# Patient Record
Sex: Female | Born: 1960 | Race: Black or African American | Hispanic: No | Marital: Married | Smoking: Never smoker
Health system: Southern US, Community
[De-identification: ages and names within clinical notes are randomized; demographics above are authoritative.]

## PROBLEM LIST (undated history)

## (undated) DIAGNOSIS — I10 Essential (primary) hypertension: Secondary | ICD-10-CM

## (undated) DIAGNOSIS — I8390 Asymptomatic varicose veins of unspecified lower extremity: Secondary | ICD-10-CM

## (undated) DIAGNOSIS — J45909 Unspecified asthma, uncomplicated: Secondary | ICD-10-CM

## (undated) HISTORY — DX: Unspecified asthma, uncomplicated: J45.909

## (undated) HISTORY — PX: ABDOMINAL HYSTERECTOMY: SHX81

## (undated) HISTORY — DX: Essential (primary) hypertension: I10

## (undated) HISTORY — DX: Asymptomatic varicose veins of unspecified lower extremity: I83.90

---

## 1998-08-30 ENCOUNTER — Encounter: Payer: Self-pay | Admitting: Obstetrics and Gynecology

## 1998-08-30 ENCOUNTER — Ambulatory Visit (HOSPITAL_COMMUNITY): Admission: RE | Admit: 1998-08-30 | Discharge: 1998-08-30 | Payer: Self-pay | Admitting: Obstetrics and Gynecology

## 1998-09-06 ENCOUNTER — Ambulatory Visit (HOSPITAL_COMMUNITY): Admission: RE | Admit: 1998-09-06 | Discharge: 1998-09-06 | Payer: Self-pay | Admitting: Obstetrics and Gynecology

## 1998-09-06 ENCOUNTER — Encounter: Payer: Self-pay | Admitting: Obstetrics and Gynecology

## 2000-01-09 ENCOUNTER — Encounter: Admission: RE | Admit: 2000-01-09 | Discharge: 2000-04-08 | Payer: Self-pay | Admitting: *Deleted

## 2000-02-20 ENCOUNTER — Other Ambulatory Visit: Admission: RE | Admit: 2000-02-20 | Discharge: 2000-02-20 | Payer: Self-pay | Admitting: *Deleted

## 2000-07-02 ENCOUNTER — Ambulatory Visit (HOSPITAL_COMMUNITY): Admission: RE | Admit: 2000-07-02 | Discharge: 2000-07-02 | Payer: Self-pay | Admitting: Obstetrics and Gynecology

## 2000-07-02 ENCOUNTER — Encounter: Payer: Self-pay | Admitting: Obstetrics and Gynecology

## 2001-07-02 ENCOUNTER — Encounter: Admission: RE | Admit: 2001-07-02 | Discharge: 2001-07-02 | Payer: Self-pay | Admitting: *Deleted

## 2001-07-02 ENCOUNTER — Encounter: Payer: Self-pay | Admitting: *Deleted

## 2001-09-01 ENCOUNTER — Other Ambulatory Visit: Admission: RE | Admit: 2001-09-01 | Discharge: 2001-09-01 | Payer: Self-pay | Admitting: Obstetrics and Gynecology

## 2001-10-13 ENCOUNTER — Encounter: Admission: RE | Admit: 2001-10-13 | Discharge: 2001-10-13 | Payer: Self-pay | Admitting: *Deleted

## 2001-10-13 ENCOUNTER — Encounter: Payer: Self-pay | Admitting: *Deleted

## 2002-04-27 ENCOUNTER — Ambulatory Visit (HOSPITAL_COMMUNITY): Admission: RE | Admit: 2002-04-27 | Discharge: 2002-04-27 | Payer: Self-pay | Admitting: Gastroenterology

## 2002-09-07 ENCOUNTER — Other Ambulatory Visit: Admission: RE | Admit: 2002-09-07 | Discharge: 2002-09-07 | Payer: Self-pay | Admitting: Obstetrics and Gynecology

## 2003-05-19 ENCOUNTER — Encounter: Admission: RE | Admit: 2003-05-19 | Discharge: 2003-05-19 | Payer: Self-pay

## 2003-06-27 ENCOUNTER — Encounter: Admission: RE | Admit: 2003-06-27 | Discharge: 2003-09-25 | Payer: Self-pay

## 2003-10-25 ENCOUNTER — Ambulatory Visit (HOSPITAL_COMMUNITY): Admission: RE | Admit: 2003-10-25 | Discharge: 2003-10-25 | Payer: Self-pay | Admitting: Obstetrics and Gynecology

## 2003-11-02 ENCOUNTER — Other Ambulatory Visit: Admission: RE | Admit: 2003-11-02 | Discharge: 2003-11-02 | Payer: Self-pay | Admitting: Obstetrics and Gynecology

## 2003-12-21 ENCOUNTER — Encounter: Admission: RE | Admit: 2003-12-21 | Discharge: 2003-12-21 | Payer: Self-pay | Admitting: Family Medicine

## 2004-11-06 ENCOUNTER — Other Ambulatory Visit: Admission: RE | Admit: 2004-11-06 | Discharge: 2004-11-06 | Payer: Self-pay | Admitting: Obstetrics and Gynecology

## 2004-11-13 ENCOUNTER — Ambulatory Visit (HOSPITAL_COMMUNITY): Admission: RE | Admit: 2004-11-13 | Discharge: 2004-11-13 | Payer: Self-pay | Admitting: Obstetrics and Gynecology

## 2005-04-30 ENCOUNTER — Encounter (INDEPENDENT_AMBULATORY_CARE_PROVIDER_SITE_OTHER): Payer: Self-pay | Admitting: Specialist

## 2005-04-30 ENCOUNTER — Observation Stay (HOSPITAL_COMMUNITY): Admission: RE | Admit: 2005-04-30 | Discharge: 2005-05-01 | Payer: Self-pay | Admitting: Obstetrics and Gynecology

## 2005-09-11 ENCOUNTER — Encounter: Admission: RE | Admit: 2005-09-11 | Discharge: 2005-09-11 | Payer: Self-pay | Admitting: Internal Medicine

## 2005-11-27 ENCOUNTER — Ambulatory Visit (HOSPITAL_COMMUNITY): Admission: RE | Admit: 2005-11-27 | Discharge: 2005-11-27 | Payer: Self-pay | Admitting: Internal Medicine

## 2006-02-05 ENCOUNTER — Other Ambulatory Visit: Admission: RE | Admit: 2006-02-05 | Discharge: 2006-02-05 | Payer: Self-pay | Admitting: Obstetrics and Gynecology

## 2006-07-08 ENCOUNTER — Ambulatory Visit (HOSPITAL_COMMUNITY): Admission: RE | Admit: 2006-07-08 | Discharge: 2006-07-08 | Payer: Self-pay | Admitting: Gastroenterology

## 2006-12-15 ENCOUNTER — Ambulatory Visit (HOSPITAL_COMMUNITY): Admission: RE | Admit: 2006-12-15 | Discharge: 2006-12-15 | Payer: Self-pay | Admitting: Internal Medicine

## 2006-12-24 ENCOUNTER — Encounter: Admission: RE | Admit: 2006-12-24 | Discharge: 2006-12-24 | Payer: Self-pay | Admitting: Internal Medicine

## 2007-07-28 ENCOUNTER — Ambulatory Visit (HOSPITAL_COMMUNITY): Admission: RE | Admit: 2007-07-28 | Discharge: 2007-07-28 | Payer: Self-pay | Admitting: Surgery

## 2007-08-11 ENCOUNTER — Encounter: Admission: RE | Admit: 2007-08-11 | Discharge: 2007-08-11 | Payer: Self-pay | Admitting: Surgery

## 2007-08-17 ENCOUNTER — Ambulatory Visit (HOSPITAL_COMMUNITY): Admission: RE | Admit: 2007-08-17 | Discharge: 2007-08-17 | Payer: Self-pay | Admitting: Surgery

## 2007-09-23 HISTORY — PX: ROUX-EN-Y PROCEDURE: SUR1287

## 2007-12-30 ENCOUNTER — Ambulatory Visit (HOSPITAL_COMMUNITY): Admission: RE | Admit: 2007-12-30 | Discharge: 2007-12-30 | Payer: Self-pay | Admitting: Obstetrics and Gynecology

## 2008-02-29 ENCOUNTER — Encounter: Admission: RE | Admit: 2008-02-29 | Discharge: 2008-04-04 | Payer: Self-pay | Admitting: Surgery

## 2008-03-28 ENCOUNTER — Inpatient Hospital Stay (HOSPITAL_COMMUNITY): Admission: RE | Admit: 2008-03-28 | Discharge: 2008-03-30 | Payer: Self-pay | Admitting: Surgery

## 2008-03-29 ENCOUNTER — Encounter (INDEPENDENT_AMBULATORY_CARE_PROVIDER_SITE_OTHER): Payer: Self-pay | Admitting: Surgery

## 2008-03-29 ENCOUNTER — Ambulatory Visit: Payer: Self-pay | Admitting: Surgery

## 2008-05-30 ENCOUNTER — Encounter: Admission: RE | Admit: 2008-05-30 | Discharge: 2008-05-30 | Payer: Self-pay | Admitting: Surgery

## 2008-08-30 ENCOUNTER — Encounter: Admission: RE | Admit: 2008-08-30 | Discharge: 2008-08-30 | Payer: Self-pay | Admitting: Surgery

## 2008-11-29 ENCOUNTER — Encounter: Admission: RE | Admit: 2008-11-29 | Discharge: 2008-11-29 | Payer: Self-pay | Admitting: Surgery

## 2009-01-01 ENCOUNTER — Ambulatory Visit (HOSPITAL_COMMUNITY): Admission: RE | Admit: 2009-01-01 | Discharge: 2009-01-01 | Payer: Self-pay | Admitting: Obstetrics and Gynecology

## 2009-05-08 ENCOUNTER — Encounter: Admission: RE | Admit: 2009-05-08 | Discharge: 2009-05-08 | Payer: Self-pay | Admitting: Surgery

## 2010-01-09 ENCOUNTER — Ambulatory Visit (HOSPITAL_COMMUNITY): Admission: RE | Admit: 2010-01-09 | Discharge: 2010-01-09 | Payer: Self-pay | Admitting: Obstetrics and Gynecology

## 2010-05-17 ENCOUNTER — Encounter: Admission: RE | Admit: 2010-05-17 | Discharge: 2010-05-17 | Payer: Self-pay | Admitting: Internal Medicine

## 2010-06-24 ENCOUNTER — Ambulatory Visit: Payer: Self-pay | Admitting: Vascular Surgery

## 2010-09-24 ENCOUNTER — Ambulatory Visit: Admit: 2010-09-24 | Payer: Self-pay | Admitting: Vascular Surgery

## 2010-10-29 ENCOUNTER — Ambulatory Visit: Payer: Self-pay | Admitting: Vascular Surgery

## 2011-01-09 ENCOUNTER — Other Ambulatory Visit (HOSPITAL_COMMUNITY): Payer: Self-pay | Admitting: Obstetrics and Gynecology

## 2011-01-09 DIAGNOSIS — Z1231 Encounter for screening mammogram for malignant neoplasm of breast: Secondary | ICD-10-CM

## 2011-01-14 ENCOUNTER — Ambulatory Visit (HOSPITAL_COMMUNITY)
Admission: RE | Admit: 2011-01-14 | Discharge: 2011-01-14 | Disposition: A | Discharge status: Home / Self Care | Payer: 59 | Source: Ambulatory Visit | Attending: Obstetrics and Gynecology | Admitting: Obstetrics and Gynecology

## 2011-01-14 DIAGNOSIS — Z1231 Encounter for screening mammogram for malignant neoplasm of breast: Secondary | ICD-10-CM

## 2011-01-15 ENCOUNTER — Other Ambulatory Visit: Payer: Self-pay | Admitting: Obstetrics and Gynecology

## 2011-01-15 DIAGNOSIS — R928 Other abnormal and inconclusive findings on diagnostic imaging of breast: Secondary | ICD-10-CM

## 2011-01-22 ENCOUNTER — Ambulatory Visit
Admission: RE | Admit: 2011-01-22 | Discharge: 2011-01-22 | Disposition: A | Discharge status: Home / Self Care | Payer: 59 | Source: Ambulatory Visit | Attending: Obstetrics and Gynecology | Admitting: Obstetrics and Gynecology

## 2011-01-22 DIAGNOSIS — R928 Other abnormal and inconclusive findings on diagnostic imaging of breast: Secondary | ICD-10-CM

## 2011-02-04 NOTE — Op Note (Signed)
Charlene Pearson, Charlene Pearson            ACCOUNT NO.:  [id number]   MEDICAL RECORD NO.:  [id number]          PATIENT TYPE:  INP   LOCATION:  0004                         FACILITY:  [facility]   PHYSICIAN:  Sandria Bales. Ezzard Standing, M.D.  DATE OF BIRTH:  [birth year]   DATE OF PROCEDURE:  03/28/2008  DATE OF DISCHARGE:                               OPERATIVE REPORT   Date of Surgery ?   PREOP DIAGNOSIS:  Morbid Obesity (BMI 44.6, Wt. 268)   POST OP DIAGNOSIS:  Morbid Obesity (BMI 44.6, Wt. 268)   PROCEDURE:  Laparoscopic Roux-En-Y Gastric Bypass (Antecolic, Antegastric)   SURGEON:  Ovidio Kin, M.D.   FIRST ASSISTANT:  Reggie Pile, M.D.   ANESTHESIA:  General Endotracheal   INDICATIONS FOR PROCEDURE:  This is a 50 year old black female, a  patient of Dr. Jerelyn Scott Husain's, who is morbidly obese and interested in  bariatric surgery.  She is interested in the RYGB and has been through  our preoperative bariatric program.   INFORMED CONSENT:  The indications and potential complications of a RYGB  were explained to the patient.  The potential complications include, but  are not limited to, bleeding, infection, perforation of the bowel, deep  venous thrombosis, open surgery, and long-term nutritional consequences.   DESCRIPTION OF PROCEDURE:  The patient presented to the operating room  and underwent general endotracheal anesthesia.  Her abdomen was prepped  with Techni-Care.  She was given 2 grams of cefoxitin at the initiation  of the procedure and PAS stockings placed and a Foley in place.  A time  out was held, identifying the patient and the procedure.  I accessed the  abdominal cavity with a 12 mm Opti-View Ethicon trocar in the left upper  quadrant.  I then placed six additional trocars, a 5 mm subxiphoid for  the liver retractor, a 12 mm right subcostal trocar, a 12 mm right  lateral subcostal trocar, an 11 mm trocar lateral to the umbilicus for  the camera in the lower part and a 5 mm  trocar in the left lateral  quadrant.   First, I did an abdominal exploration.  She did have some adhesions over  her mid-anterior abdominal wall for unclear reasons.  I took these  omental adhesions down.  They appeared benign.  Both lobes of the liver  were unremarkable.  The stomach was unremarkable.  The bowel that I  could see was unremarkable.  There were no other masses or nodules  within the abdomen.   I then identified the ligament of Treitz.  I counted 40 cm beyond this  and divided the jejunum with a light load of the Endo GI 45 mm Ethicon  stapler.  I divided the mesentery.  I then marked the future catheter  plane with a Penrose drain.  I then counted 100 cm for the future  gastric limb and did a side-to-side jejunojejunostomy.  I did this  anastomosis using a 45 mm Ethicon Endo GI stapler, and then I closed the  enterotomy with two running #2-0 Vicryl sutures.  I did have to put in  three additional sutures to  make sure the enterotomy was completely  closed.  I then closed the jejunal defect with #2-0 silk suture and  placed Tisseel over the jejunojejunostomy.  I then divided the omentum  down to the transverse colon, to allow the future jejunal lumen to reach  more easily to the stomach.   I then turned my attention to the upper abdomen.  The patient was placed  in a reverse Trendelenburg with her head up, and Nathan's retractor  placed over the left lobe of the liver.  I created a gastric pouch which  is approximately 5 cm x 3 cm in size.  I first went along the angle of  His and opened this.  I then went along the lesser curvature and divided  into the lesser sac.  I then used a 45 mm blue Ethicon stapler.  Then I  used two #60 blue staples and then another #45 blue staple to create the  stomach pouch.   I oversewed the gastric remnant with a running #2-0 Vicryl suture.  She  did have one little bleeder about one-third up the gastric remnant.  I  placed two clips on  this.  I then placed a Tisseel over the greater  curvature new pouch.  I then started my gastrojejunal anastomosis.  I  did a running posterior suture of #2-0 Vicryl suture.  I then cauterized  and did an enterotomy of the stomach and the small bowel and then  stapled a side-to-side anastomosis with an Endo GIA blue staple load.  I  then closed the enterotomy with two interrupted #2-0 Vicryl sutures.  I  then did an anterior 2-0 Vicryl suture running, so she had a double  layer closure.  I passed the Ewald tube through the anastomosis before  the second layer was closed.   At this time Dr. Alfonse Ras scrubbed out and I clamped off the  small bowel.  She  did an upper endoscopy.  She saw a 4 cm pouch.  The  gastrojejunostomy was open.  I saw the upper abdomen and there was no  evidence of any air leak.  I then irrigated the abdomen.  I looked at  the Peterson's defect.  It looked like the colon was well tucked behind  it.  It was not even accessible, so I did not do any kind of Peterson's  defect closure.  I then placed Tisseel over the gastrojejunostomy, over  the jejunostomy.  I then re-inspected the abdomen.  I saw no  evidence  of a leak or bleeding.  I removed all the trocars.  The skin entry site  was injected with a total of about 30 mL of 0.25% Marcaine.  I closed  the skin with skin staples and sterilely dressed the wound.    Sponge and needle count were correct at the end of the case.   The patient tolerated the procedure well and was transported to the  recovery room in good condition.      Sandria Bales. Ezzard Standing, M.D.  Electronically Signed     DHN/MEDQ  D:  03/28/2008  T:  03/28/2008  Job:  [id number]   cc:   Georgann Housekeeper, MD  Fax: [phone]   Anselmo Rod, M.D.  Fax: [phone]

## 2011-02-04 NOTE — Consult Note (Signed)
NEW PATIENT CONSULTATION   Charlene, SCHUL Pearson  DOB:  04/02/1961                                       06/24/2010  EAVWU#:[id number]   The patient is a 50 year old female patient with a long history of  venous insufficiency which has recently worsened and caused increasing  symptomatology.  She had laser ablation performed at [facility] in 2005 for bulging varicosities and had a minor stripping  procedure done on the right leg below the knee at the same facility.  She has developed recurrent varicosities in the left calf with diffuse  reticular veins becoming progressively more severe involving the distal  thigh, calf and ankle area.  She has also developed an increased pattern  of reticular and bulging varicosities on the right side particularly in  the medial right thigh and medial calf and extending down on to the  ankle.  She has no history of DVT or superficial thrombophlebitis.  She  has had no bleeding or ulceration.  She has not worn elastic compression  stockings recently and elevation does not seem to improve her  symptomatology.  She cannot take ibuprofen because of recent weight loss  surgery.  She states that the symptoms which she describes as burning  and throbbing in nature are continuing to worsen and affect her ability  to stand and work.   CHRONIC MEDICAL PROBLEMS:  1. Hypertension.  2. History of morbid obesity with weight loss surgery in 2009.  3. History of asthma occasionally.  4. History of borderline diabetes and hyperlipidemia which have been      relieved by her weight loss surgery.  5. Negative for coronary artery disease or stroke.   SOCIAL HISTORY:  She is married, has two children, is self-employed as a  Patent attorney.  She does not use tobacco or alcohol.   Family history is positive for diabetes and stroke in her mother.  Positive for coronary artery disease and diabetes in a grandmother.   REVIEW OF SYSTEMS:   She has lost 90 pounds since weight loss surgery 2  years ago.  She does have discomfort in her legs as noted above.  Able  to ambulate up to 1 mile.  Denies any chest pain, dyspnea on exertion,  heartburn, urinary symptoms.  All other systems are negative in complete  review of systems.   PHYSICAL EXAM:  Vital signs:  Blood pressure 98/60, heart rate 60,  respirations 22.  General:  She is a well-developed, well-nourished  female in no apparent distress, alert and oriented x3.  HEENT:  Exam  normal for age.  EOMs intact.  Lungs:  Clear to auscultation.  No  rhonchi or wheezing.  Cardiovascular:  Regular rhythm.  No murmurs.  Carotid pulses 3+.  No bruits.  Abdomen:  Soft, nontender with no  masses.  Musculoskeletal:  Free of major deformities.  Neurological:  Normal.  Skin:  Free of rashes.  Lower extremity:  Exam reveals 3+  femoral, popliteal and dorsalis pedis pulses bilaterally.  Left leg has  bulging varicosities in the medial calf over the great saphenous system.  She has multiple reticular veins in the medial and lateral thigh and  calf area extending down to the left ankle with 1+ edema.  The right leg  has an extensive pattern of reticular veins and bulging varicosities in  the medial thigh and medial calf with 1+ edema distally and extensive  reticular veins extending down the lower third of the leg on to the  ankle and lateral foot.  No rashes or ulcers noted.   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  No right leg study was done.  I performed a  bedside SonoSite ultrasound study today which revealed the right leg to  have gross reflux at the right saphenofemoral junction throughout the  right great saphenous vein feeding these varicosities.  On the left leg  there is reflux at the saphenofemoral junction with some patency of the  great saphenous  vein down to the knee level which seems intermittent.  I  recommended long-leg elastic compression stockings (20 mm-30 mm  gradient) as well as elevation and Tylenol which she can take for pain  since she cannot take ibuprofen.  She will return in 3 months.  If there  has been no improvement I think she should have the following:  1) laser  ablation to the right great saphenous vein with some stab phlebectomies  at the time of the procedure and three courses of sclerotherapy, 2) will  make a decision regarding further laser ablation of the left great  saphenous system following a formal ultrasound study and/or stab  phlebectomies and sclerotherapy on the left side.  She will return in 3  months.     Charlene Pearson, Pearson.D.  Electronically Signed   JDL/MEDQ  D:  06/24/2010  T:  06/25/2010  Job:  [id number]   cc:   Georgann Housekeeper, MD

## 2011-02-07 NOTE — Op Note (Signed)
   NAME:  Charlene Pearson, Charlene Pearson                      ACCOUNT NO.:  [id number]   MEDICAL RECORD NO.:  [id number]                   PATIENT TYPE:  AMB   LOCATION:  ENDO                                 FACILITY:  [facility]   PHYSICIAN:  Charna Elizabeth, M.D.                   DATE OF BIRTH:  05-12-1961   DATE OF PROCEDURE:  04/27/2002  DATE OF DISCHARGE:                                 OPERATIVE REPORT   PROCEDURE PERFORMED:  Esophagogastroduodenoscopy.   ENDOSCOPIST:  Charna Elizabeth, M.D.   INSTRUMENT USED:  Olympus video panendoscope.   INDICATIONS FOR PROCEDURE:  50 year old African-American female with a  history of iron deficiency anemia. Rule out peptic ulcer disease,  esophagitis, and gastritis.   PREPROCEDURE PREPARATION:  Informed consent was procured from the patient.  The patient fasted for eight hours prior to the procedure.   PREPROCEDURE PHYSICAL:  VITAL SIGNS:  The patient had stable vital signs.  NECK:  Supple.  LUNGS:  Chest was clear to auscultation.  CARDIAC:  S1, S2 regular.  ABDOMEN:  Abdomen is soft with normal bowel sounds.   DESCRIPTION OF PROCEDURE:  The patient was placed in the left lateral  decubitus position.  Sedated with 40 mg of Demerol and 4 mg of Versed  intravenously.  Once the patient was adequately sedated and maintained on  low-flow oxygen and continuous cardiac monitoring, the Olympus video  panendoscope was advanced through the mouthpiece, over the tongue, into the  esophagus, and under direct vision, the entire esophagus appeared normal  with no evidence of ring, stricture, masses, esophagitis, or Barrett's  mucosa.  The scope was then advanced in the stomach.  A hiatal hernia was  seen on retroflexion, but the patient did not tolerate retroflexion very  well.  There was some residual debris in the stomach.  The exact time when  the patient last ate was not clear.  However, no solid debris was present,  and the antrum and the proximal stomach appeared  normal without lesions.   IMPRESSION:  1. Hiatal hernia.  2. Some debris (liquid) along greater curvature.  3. Otherwise normal-appearing stomach and proximal small bowel.    RECOMMENDATIONS:  1. Proceed with colonoscopy at this time.  2. Gastric emptying study if the patient has any symptoms of gastroparesis.  3. Further recommendations after colonoscopy.                                               Charna Elizabeth, M.D.    JM/MEDQ  D:  04/27/2002  T:  05/01/2002  Job:  [id number]   cc:   Heather Roberts, M.D.

## 2011-02-07 NOTE — Discharge Summary (Signed)
Charlene Pearson, Charlene Pearson            ACCOUNT NO.:  [id number]   MEDICAL RECORD NO.:  [id number]          PATIENT TYPE:  INP   LOCATION:  9304                          FACILITY:  [facility]   PHYSICIAN:  Hal Morales, M.D.DATE OF BIRTH:  [birth year]   DATE OF ADMISSION:  04/30/2005  DATE OF DISCHARGE:  05/01/2005                                 DISCHARGE SUMMARY   DISCHARGE DIAGNOSES:  1.  Chronic pelvic pain.  2.  Endometriosis.  3.  Uterine fibroids.  4.  Menorrhagia.   OPERATION:  On the date of admission, the patient underwent laparoscopy  followed by a total vaginal hysterectomy with uterine morcellation,  tolerating the procedure well. The patient's uterus was enlarged to  approximately 14-week size with several uterine fibroids and general global  uterine enlargement. The tubes were status post tubal interruption for  sterilization. The ovaries were normal without evidence of any of  endometriosis. There was no evidence of endometriosis in the posterior cul-  de-sac.   HISTORY OF PRESENT ILLNESS:  Charlene Pearson is a 50 year old married African-  American female para 3-0-0-3 who presents for a hysterectomy because of  chronic pelvic pain, menorrhagia, and endometriosis. Please see the  patient's dictated history and physical examination for details.   PREOPERATIVE PHYSICAL EXAMINATION:  VITAL SIGNS:  Blood pressure 130/70,  weight is 259, height is 5 feet, 6-and-three-quarter inches tall.  GENERAL EXAM:  Within normal limits.  PELVIC EXAM:  EG/BUS is within normal limits. Vagina is rugose. Cervix is  nontender without lesions. Uterus appears 12- to 14-week size without  tenderness. Adnexa without tenderness or masses. Rectovaginal exam was  without tenderness or masses.   HOSPITAL COURSE:  On the date of admission the patient underwent  aforementioned procedures, tolerating them all well. Postoperative course  was unremarkable, with the patient tolerating a postoperative  hemoglobin of  9.4 (preoperative hemoglobin 10.4). By postoperative day #1, the patient had  resumed bowel and bladder function and was therefore deemed ready for  discharge home.   DISCHARGE MEDICATIONS:  1.  Lotrel 5/20 one tablet daily.  2.  Lipitor 10 mg one tablet daily.  3.  Iron one tablet twice daily for 6 weeks.  4.  Colace 100 mg one tablet two to three times daily until bowel movements      are regular.  5.  Ibuprofen 600 mg one tablet with food every 6 hours for 3 days, then as      needed for pain.  6.  Tylox one to two tablets every 4-6 hours as needed for pain.  7.  Phenergan 25 mg one tablet every 6 hours for nausea.   FOLLOW-UP:  The patient is scheduled for a 6 weeks postoperative visit with  Dr. Pennie Rushing on June 11, 2005 at 11:30 a.m.   DISCHARGE INSTRUCTIONS:  The patient was given a copy of [facility] postoperative instruction sheet. She was further advised to avoid  driving for 2 weeks, heavy lifting for 4 weeks, intercourse for 6 weeks, but  was advised that she may shower, bathe, and walk up steps. The patient's  diet was without restriction.   FINAL PATHOLOGY:  Uterus and cervix:  Cervix with squamous metaplasia.  Secretory endometrium without evidence of hyperplasia or malignancy.  Leiomyomata, multiple, submucosal, intramural, and subserosal. Benign  uterine serosa, no evidence of endometriosis or malignancy.      [name].      Hal Morales, M.D.  Electronically Signed    EJP/MEDQ  D:  06/04/2005  T:  06/04/2005  Job:  [id number]

## 2011-02-07 NOTE — Op Note (Signed)
   NAME:  JUN, RIGHTMYER                      ACCOUNT NO.:  [id number]   MEDICAL RECORD NO.:  [id number]                   PATIENT TYPE:  AMB   LOCATION:  ENDO                                 FACILITY:  [facility]   PHYSICIAN:  Charna Elizabeth, M.D.                   DATE OF BIRTH:  [birth year]   DATE OF PROCEDURE:  04/27/2002  DATE OF DISCHARGE:                                 OPERATIVE REPORT   PROCEDURE PERFORMED:  Screening colonoscopy.   ENDOSCOPIST:  Charna Elizabeth, M.D.   INSTRUMENT USED:  Olympus video colonoscope   INDICATIONS FOR PROCEDURE:  A 50 year old African-American female with a  history of iron deficiency anemia and unrevealing EGD.  Rule out colonic  polyps, masses, hemorrhoids, etc.   PROCEDURE PREPARATION:  Informed consent was procured from the patient.  The  patient was fasted for eight hours prior to the procedure and prepped with a  bottle of  magnesium citrate and a gallon of NuLytely the night prior to the  procedure.   PREPROCEDURE PHYSICAL:  VITAL SIGNS:  The patient had stable vital signs.  NECK:  Supple.  CHEST: Clear to auscultation.  S1 and S2 regular.  ABDOMEN:  Soft with normal bowel sounds.   DESCRIPTION OF PROCEDURE:  The patient was placed in the left lateral  decubitus position, sedated with an additional 20 mg of Demerol and 2 mg of  Versed intravenously.  Once the patient was adequately sedated and  maintained on low flow oxygen and continuous cardiac monitoring, the Olympus  video colonoscope was advanced from rectum to the cecum and terminal ileum  without difficulty.  The entire colonic mucosa appeared healthy with a  normal vascular pattern.  No masses, polyps, erosions,  ulcerations or  diverticula were seen.  The appendiceal orifice and ileocecal valve were  clearly visualized and photographed.  Small internal hemorrhoids were seen  on retroflection of the rectum.   IMPRESSION:  Normal colonoscopy up to the terminal ileum except for  small  amount of internal hemorrhoids.   RECOMMENDATIONS:  Repeat CBC on an outpatient basis.  Further  recommendations may be made after this.                                                Charna Elizabeth, M.D.    JM/MEDQ  D:  04/27/2002  T:  05/01/2002  Job:  [id number]   cc:   Heather Roberts, M.D.

## 2011-02-07 NOTE — H&P (Signed)
NAME:  NALINI, ALCARAZ            ACCOUNT NO.:  [id number]   MEDICAL RECORD NO.:  [id number]           PATIENT TYPE:   LOCATION:                                 FACILITY:   PHYSICIAN:  Hal Morales, M.D.DATE OF BIRTH:  1961/03/03   DATE OF ADMISSION:  04/30/2005  DATE OF DISCHARGE:                                HISTORY & PHYSICAL   HISTORY OF PRESENT ILLNESS:  Charlene Pearson is a 50 year old married African-  American female para 3-0-0-3 who presents for a hysterectomy because of  chronic pelvic pain, menorrhagia, and endometriosis.  For the past 10 years  patient has experienced pelvic pain during her mid cycle, premenstrually,  and with intercourse.  Patient's pain was rated as an 8/10 on a 10-point  scale with radiation down her left leg and minimal response to ibuprofen.  An early ultrasound in 1995 revealed an enlarged uterus measuring 12.3 x 6.3  x 7.4 with an inhomogeneous myometrium consistent with adenomyosis.  In  December of that year patient underwent a diagnostic laparoscopy to further  assess her pain and was found to have endometriosis.  Following the  laparoscopic procedure patient began a course of Provera 30 mg daily and  took that therapy for two months with total resolution of her pain.  Afterwards, however, she then developed menses so heavy that she soiled her  clothes.  Provera was then presumed with improvement in menstrual flow and  both mid cycle and premenstrual pain which had also recurred.  Over time  this therapy failed to contain her pain and bleeding such that patient's  hemoglobin dropped to a low of 10.3.  The lowest during this entire ordeal  has been 9.2.  A course of Lupron was then initiated with alleviation of  symptoms but due to severe vasomotor and related side effects, patient  completed only three of six months of therapy.  Over the next seven months  patient was pain and menses-free but once menses resumed they were as heavy  as  ever, lasting eight days, and requiring the use of pads plus tampons to  be changed every hour.  A sonohysterogram in October of 2001 showed multiple  uterine fibroids with the largest measuring 4.8 x 1.4 x 1.9 cm and an  endometrial polyp measuring 4.4 x 3.1 mm.  Provera 30 mg was again initiated  with which patient became oligomenorrheic and her pain diminished.  After a  year patient was again plagued with heavy periods and pain.  An endometrial  biopsy April 2005 yielded a diagnosis of simple hyperplasia without atypia  and Provera for 10 days monthly for three months was prescribed.  Though  patient did not take the Provera as prescribed, a repeat endometrial biopsy  in March of 2006 showed no hyperplasia or malignancy and only fragments of  endometrial polyps were seen on pathology.  Patient has had a normal TSH and  prolactin along with negative gonorrhea and Chlamydia cultures throughout  her work-up.  Currently, patient remains amenorrheic on Provera 40 mg daily,  but continues to have 7-8/10 pain on a 10-point scale with  moderate, but not  complete relief with ibuprofen 800 mg.  A review of medical and surgical  options were relayed to patient to include various hormonal therapies,  Lupron, uterine artery embolization, endometrial ablation, Mirena IUD,  hysteroscopy with D&C, and hysterectomy.  Due to the protracted and  debilitating nature of her symptoms, patient has decided to proceed with  definitive therapy in the form of hysterectomy.   PAST MEDICAL HISTORY:   OB HISTORY:  Gravida 3, para 3-0-0-3.  Patient delivered in 1985, 1989, and  1990.   GYN HISTORY:  Menarche 50 years old.  Last menstrual period was March 2006  (see history of present illness).  Patient uses vasectomy as her method of  contraception.  Patient denies any history of abnormal Pap smears.  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  Her last normal Pap smear and  mammogram were in February of  2006.   MEDICAL HISTORY:  1.  Positive for borderline glucose intolerance.  2.  Lactose intolerance.  3.  Positive TB skin test which was treated for two months.  4.  Ovarian cysts.  5.  Hypertension.  6.  Asthmatic bronchitis.  7.  Endometrial polyps.  8.  Uterine fibroids.  9.  Elevated liver function tests (evaluated by gastroenterologists with no      further problems with liver enzyme elevation).  10. Endometrial hyperplasia without atypia.   SURGICAL HISTORY:  1.  In 1995 diagnostic laparoscopy.  Diagnosed with endometriosis.  2.  In 2001 left foot neuroma removal.   The patient denies any problems with anesthesia or any history of blood  transfusion.   FAMILY HISTORY:  Positive for diabetes and hypertension.   SOCIAL HISTORY:  Patient is married and she works as a Building control surveyor.   HABITS:  She does not use alcohol or tobacco.   CURRENT MEDICATIONS:  1.  Lotrel 5/20 one tablet daily.  2.  Lipitor 10 mg one tablet daily.  3.  Provera 40 mg daily.  4.  Multivitamin with calcium one tablet daily.  5.  Ibuprofen 800 mg every eight hours as needed with food.  6.  Echinacea as needed (patient advised to discontinue this herb prior to      surgery).   REVIEW OF SYSTEMS:  Patient is keloid prone.  She wears glasses.  Otherwise,  negative except as mentioned in history of present illness.   PHYSICAL EXAMINATION:  VITAL SIGNS:  Blood pressure 130/70, weight 259  pounds, height 5 feet 6-3/4 inches tall.  NECK:  Supple without adenopathy or thyromegaly.  HEART:  Regular rate and rhythm.  There is no murmur.  LUNGS:  Clear to auscultation.  There are no wheezes, rales, or rhonchi.  BACK:  No CVA tenderness.  ABDOMEN:  Bowel sounds are present.  It is soft.  There is no tenderness,  guarding, rebound, or organomegaly.  EXTREMITIES:  Without clubbing, cyanosis, edema.  PELVIC:  EGBUS is within normal limits.  Vagina is rugose.  Cervix is nontender without lesions.   Uterus appears 12-14 weeks size without  tenderness.  Adnexa without tenderness or masses.  Rectovaginal without  tenderness or masses.   IMPRESSION:  1.  Chronic pelvic pain.  2.  Endometriosis.  3.  Menorrhagia.  4.  Possible adenomyosis.   DISPOSITION:  As previously outlined.  A discussion was held with patient  regarding medical and surgical options for management of her symptoms and  patient has consented  to proceed with definitive therapy in the form of  hysterectomy.  Patient understands the indications for her procedure along  with its risks which include, but are not limited to, reaction to  anesthesia, damage to adjacent organs, infection, and excessive bleeding.  Patient has received a copy of the ACOG brochure entitled Preparing for  Surgery and Understanding Hysterectomy.  She has also received  instructions on bowel preparation for her surgery.  Patient has consented to  proceed with laparoscopy with a possible total vaginal hysterectomy or  possible total abdominal hysterectomy with the possibility of a bilateral  salpingo-oophorectomy at [facility] on April 30, 2005 at  8:30 a.m.       EJP/MEDQ  D:  04/25/2005  T:  04/25/2005  Job:  [id number]

## 2011-02-07 NOTE — Op Note (Signed)
NAMEAUBREANNA, Charlene Pearson            ACCOUNT NO.:  [id number]   MEDICAL RECORD NO.:  [id number]          PATIENT TYPE:  INP   LOCATION:  9399                          FACILITY:  [facility]   PHYSICIAN:  Hal Morales, M.D.DATE OF BIRTH:  May 22, 1961   DATE OF PROCEDURE:  04/30/2005  DATE OF DISCHARGE:                                 OPERATIVE REPORT   PREOPERATIVE DIAGNOSES:  1.  Chronic pelvic pain.  2.  Endometriosis.  3.  Uterine fibroids.  4.  Menorrhagia.  5.  Adenomyosis.   POSTOPERATIVE DIAGNOSES:  1.  Chronic pelvic pain.  2.  Endometriosis.  3.  Uterine fibroids.  4.  Menorrhagia.  5.  Adenomyosis.   OPERATION:  Laparoscopy, total vaginal hysterectomy with uterine  morcellation.   SURGEON:  Hal Morales, M.D.   FIRST ASSISTANT:  [name].   ANESTHESIA:  General orotracheal.   ESTIMATED BLOOD LOSS:  300 mL.   COMPLICATIONS:  None.   FINDINGS:  The uterus was enlarged to approximately 14-week size with  several uterine fibroids and general global uterine enlargement.  The tubes  were status post tubal interruption for sterilization.  The ovaries were  normal without evidence of endometriosis.  There was no evidence of  endometriosis in the posterior cul-de-sac.   PROCEDURE:  The patient was taken to the operating room after appropriate  identification and placed on the operating table.  After the attainment of  adequate general anesthesia, she was placed in modified lithotomy position.  The abdomen, perineum and vagina were prepped with multiple layers of  Betadine and a Foley catheter inserted into the bladder and connected to  straight drainage.  The abdomen and perineum were draped as a sterile field.  The subumbilical and suprapubic regions were infiltrated with a total of 10  mL of  0.25% Marcaine.  A subumbilical incision was made and a Veress  cannula placed through that incision into the perineal cavity.  A  pneumoperitoneum was created  with 4 L of CO2.  The Veress cannula was removed and the laparoscopic trocar placed through  that incision into the peritoneal cavity.  The laparoscope was placed  through the trocar sleeve.  A suprapubic incision was made to the left of  midline and a laparoscopic probe trocar placed through that incision into  the peritoneal cavity under direct visualization.  The above-noted findings  were made and the decision to proceed with vaginal hysterectomy made.  The  vagina then became the site of operation.  A weighted speculum was placed in  the posterior vagina and Lahey tenacula placed on the anterior and posterior  surfaces of the cervix.  The cervicovaginal mucosa was infiltrated with a  dilute solution of Pitressin.  The cervix was then circumscribed and the anterior vaginal mucosa dissected  bluntly off the anterior cervix.  The anterior peritoneum was entered.  The  posterior peritoneum was then entered sharply and tagged.  The uterosacral  ligaments on the right and left first clamped, cut and suture ligated.  Paracervical tissues and uterine arteries were then clamped, cut and suture  ligated  on the right and left side.  At that time, uterine morcellation was  begun and it had to the uterus cored successively to reveal several large  uterine fibroids, which were removed with a combination of blunt and sharp  dissection.  Once four large fibroid had been removed, the uterus was reduced to the  extent that the parametrial tissues could be clamped, cut and suture  ligated.  The uterus was then inverted after further morcellation and the  upper pedicles could be clamped, cut, tied with free ties and suture  ligated.  Bleeding sites on either side of the upper pedicles were treated with suture  ligation to achieve adequate hemostasis.  The McCall culdoplasty suture was  then placed in the posterior cul-de-sac incorporating the uterosacral  ligaments on either side and the intervening  posterior peritoneum.  This  suture was held.  The uterosacral ligament sutures had been held and were then used to create  the vaginal angles by tying down the suture after placing it through the  anterior vaginal cuff and the posterior vaginal cuff.  The remainder of the  vaginal cuff was closed in a single layer incorporating the anterior vaginal  mucosa, anterior peritoneum, posterior peritoneum and posterior vaginal  mucosa in figure-of-eight sutures, all of 0 Vicryl.  At this time hemostasis  was noted to be adequate and the [facility] culdoplasty suture was tied down.  The vagina was then packed with two-inch gauze moistened with Estrace cream.  The surgeon changed gloves and closed the incisions in the subumbilical and  suprapubic region.  The subumbilical incision was closed with a fascial  suture of 0 Vicryl and a subcuticular suture of 3-0 Vicryl.  The suprapubic  incision was closed with a subcuticular suture of 3-0 Vicryl.  The patient  was awakened from general anesthesia and taken to the recovery room in  satisfactory condition having tolerated procedure well, with sponge and  instrument counts correct.   SPECIMENS TO PATHOLOGY:  Morcellated uterus and cervix.      Hal Morales, M.D.  Electronically Signed     VPH/MEDQ  D:  04/30/2005  T:  04/30/2005  Job:  [id number]

## 2011-02-07 NOTE — Op Note (Signed)
Charlene Pearson, Charlene Pearson            ACCOUNT NO.:  [id number]   MEDICAL RECORD NO.:  [id number]          PATIENT TYPE:  AMB   LOCATION:  ENDO                         FACILITY:  [facility]   PHYSICIAN:  Anselmo Rod, M.D.  DATE OF BIRTH:  03-Sep-1961   DATE OF PROCEDURE:  07/09/2006  DATE OF DISCHARGE:  07/08/2006                                 OPERATIVE REPORT   PROCEDURE:  Colonoscopy.   ENDOSCOPIST:  Anselmo Rod, M.D.   INSTRUMENT:  Olympus video colonoscope.   INDICATIONS FOR PROCEDURE:  A 50 year old African-American white female with  a history of severe left lower quadrant pain status post hysterectomy  undergoing colonoscopy for guaiac-positive stool.  The patient was found to  have question diverticulosis on a recent CT.   PROCEDURE PREPARATION:  Informed consent was procured from the patient.  The  patient fasted for 8 hours prior to the procedure and prepped with Dulcolax  pills and a gallon of TriLyte the night prior to the procedure.  The risks  and benefits of the procedure including a 10% miss rate of cancer and polyps  was discussed with the patient as well.   PRE-PROCEDURE PHYSICAL:  The patient had stable vital signs.  NECK:  Supple.  CHEST:  Clear to auscultation.  S1, S2 regular.  ABDOMEN:  Soft with normal bowel sounds.   DESCRIPTION OF THE PROCEDURE:  The patient was placed in left lateral  decubitus position and sedated with 100 mcg fentanyl and 10 mg of Versed in  slow incremental doses.  Once the patient was adequately sedated and  maintained on low-flow oxygen and continuous cardiac monitoring, the Olympus  video colonoscope was advanced into the rectum to the cecum.  The  appendiceal orifice and ileocecal valve were visualized and photographed.  The terminal ileum appeared healthy and without lesions.  The patient  experienced some abdominal discomfort with insufflation of air into the  colon indicating apparent hypersensitivity.  No masses, polyps,  erosions,  ulcerations or diverticula were seen.  The patient tolerated the procedure  well without complications.   The patient told me that her left lower quadrant pain seems to have resolved  after her prep and I suspect her left lower quadrant pain is due to chronic  constipation/constipation-predominant IBS.   IMPRESSION:  Normal colonoscopy up to the cecum.  Question visceral  hypersensitivity.   RECOMMENDATIONS:  1. Repeat guaiac will be done on an outpatient basis.  2. Trial of Amitiza.  3. Continue on high fiber diet and liberal fluid intake.  4. Outpatient followup in the next 2 weeks.  Repeat colonoscopy has been      recommended in the next 10 years unless the patient develops any      abnormal symptoms in the interim.      Anselmo Rod, M.D.  Electronically Signed     JNM/MEDQ  D:  07/09/2006  T:  07/10/2006  Job:  [id number]   cc:   Georgann Housekeeper, MD  Hal Morales, M.D.

## 2011-06-19 LAB — CBC
HCT: 29.9 — ABNORMAL LOW
HCT: 32.2 — ABNORMAL LOW
Hemoglobin: 9.8 — ABNORMAL LOW
MCHC: 32.8
MCHC: 32.8
MCV: 81
MCV: 81.3
Platelets: 233
RBC: 3.68 — ABNORMAL LOW
RDW: 15.7 — ABNORMAL HIGH
WBC: 6.8

## 2011-06-19 LAB — DIFFERENTIAL
Basophils Absolute: 0
Basophils Relative: 0
Basophils Relative: 1
Eosinophils Absolute: 0
Eosinophils Absolute: 0.1
Eosinophils Relative: 0
Eosinophils Relative: 1
Lymphocytes Relative: 14
Lymphs Abs: 2.4
Monocytes Absolute: 0.5
Monocytes Absolute: 0.8
Monocytes Relative: 8
Neutrophils Relative %: 56

## 2011-06-19 LAB — HEMOGLOBIN AND HEMATOCRIT, BLOOD
HCT: 34.6 — ABNORMAL LOW
Hemoglobin: 12

## 2011-06-19 LAB — BASIC METABOLIC PANEL
BUN: 8
Calcium: 8.9
GFR calc non Af Amer: >60
Potassium: 3.1 — ABNORMAL LOW
Sodium: 143

## 2012-04-14 ENCOUNTER — Encounter (INDEPENDENT_AMBULATORY_CARE_PROVIDER_SITE_OTHER): Payer: Self-pay | Admitting: Surgery

## 2012-10-07 ENCOUNTER — Telehealth (INDEPENDENT_AMBULATORY_CARE_PROVIDER_SITE_OTHER): Payer: Self-pay | Admitting: Surgery

## 2012-10-07 NOTE — Telephone Encounter (Signed)
10/07/12 tried to reach pt by phone-no machine-mailed recall letter to call [phone] to schedule a bariatric surgery follow-up appt with Dr. Ezzard Standing. Has not been seen by DN since [facility] 03/28/08. (lss)

## 2012-10-25 ENCOUNTER — Telehealth (INDEPENDENT_AMBULATORY_CARE_PROVIDER_SITE_OTHER): Payer: Self-pay | Admitting: Surgery

## 2012-10-25 NOTE — Telephone Encounter (Signed)
10/25/12 I received the lost to follow-up survey from patient via mail. She states she does not wish to schedule an appointment at this time because she has no insurance due to the death of her husband. (insurance canceled) The patient also states that she has been unable to obtain insurance due to her gastric bypass surgery as they consider it a pre-existing condition (obesity). Copy of survey given to Dr Ezzard Standing and Dr Daphine Deutscher. cef

## 2013-06-02 ENCOUNTER — Other Ambulatory Visit: Payer: Self-pay | Admitting: Internal Medicine

## 2013-06-02 DIAGNOSIS — Z1231 Encounter for screening mammogram for malignant neoplasm of breast: Secondary | ICD-10-CM

## 2013-06-07 ENCOUNTER — Ambulatory Visit: Payer: 59

## 2013-11-23 ENCOUNTER — Ambulatory Visit (INDEPENDENT_AMBULATORY_CARE_PROVIDER_SITE_OTHER): Payer: No Typology Code available for payment source | Admitting: Surgery

## 2013-11-23 ENCOUNTER — Other Ambulatory Visit (INDEPENDENT_AMBULATORY_CARE_PROVIDER_SITE_OTHER): Payer: Self-pay

## 2013-11-23 ENCOUNTER — Encounter (INDEPENDENT_AMBULATORY_CARE_PROVIDER_SITE_OTHER): Payer: Self-pay | Admitting: Surgery

## 2013-11-23 VITALS — BP 140/90 | HR 68 | Temp 98.7°F | Resp 14 | Ht 65.5 in | Wt 225.6 lb

## 2013-11-23 DIAGNOSIS — Z1231 Encounter for screening mammogram for malignant neoplasm of breast: Secondary | ICD-10-CM

## 2013-11-23 DIAGNOSIS — Z9884 Bariatric surgery status: Secondary | ICD-10-CM

## 2013-11-23 NOTE — Progress Notes (Addendum)
Re:   Charlene Pearson DOB:   January 08, 1961 MRN:   [id number]  ASSESSMENT AND PLAN: 1.  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 Annastacia Duba  Original weight - 268, BMI - 44.6  She has had weight gain and is worried about her pouch stretching.   Will do an UGI to check her pouch, but I told her that I have not found this to be a problem.  Will refer her to the nutritionist to go over the gastric bypass diet.  We gave her a surgery/diet card to present at restaurant's.  She'll work on exercising.  We'll try to get labs from Dr. Glenna Durand office.  I'll see her back in 6 months.  [UGI showed normal post gastric bypass pouch.  Discussed with patient.  DN  11/25/2013] 2.  Asthma 3.  Hypertension  Restarted Diovan 4.  Reflux - resolved 5.  Sebaceous cyst of upper back 6.  She had run a mild anemia and low Fe on labs from November 09, 2007.  She said that her last labs at Dr. Glenna Durand office were okay.  Chief Complaint  Patient presents with  . Weight Loss Surgery    f/u bypass sx 11/09/2007   REFERRING PHYSICIAN: [name], MD  HISTORY OF PRESENT ILLNESS: Charlene Pearson is a 53 y.o. (DOB: [birth year])  AA  female whose primary care physician is HUSAIN,KARRAR, MD and comes to me today for follow up of a gastric bypass. I last saw her on 02/27/2011.  Her weight at that time was 201, today it is 225. When her husband died, she lost her insurance and her gastric bypass was considered a "pre-existing condition". She has purchased health care through the government web site and now has some coverage. She admits that she is not eating as well as she should. She drinks 3 ot 4 cups of coffee a day.  She avoids carbonated drinks, but drinks a lot of sugar free drinks.  Her right knee has bothered her and she admits that she is not exercising much. She moved out of the house that she and her husband had. [place]   Past Medical History  Diagnosis Date  . Asthma   . Hypertension      Past  Surgical History  Procedure Laterality Date  . 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 valsartan-hydrochlorothiazide (DIOVAN-HCT) 160-12.5 MG per tablet Take 1 tablet by mouth daily.      Marland Kitchen PROAIR HFA 108 (90 BASE) MCG/ACT inhaler        No current facility-administered medications for this visit.    Allergies  Allergen Reactions  . Amoxicillin Itching   REVIEW OF SYSTEMS: Cardiac:  Hypertension.   Just recently restarted on BP meds. Pulmonary:  History of asthma. Musculoskeletal:  Right knee is bothering her.  SOCIAL and FAMILY HISTORY: Husband died 11-09-2011 (she lost her insurance and failed follow up).  He had multiple myeloma, treated by Dr. Cyndie Chime and [place]. She works Personal assistant products.  PHYSICAL EXAM: BP 140/90  Pulse 68  Temp(Src) 98.7 F (37.1 C) (Oral)  Resp 14  Ht 5' 5.5" (1.664 m)  Wt 225 lb 9.6 oz (102.331 kg)  BMI 36.96 kg/m2  General: AA who is alert and generally healthy appearing.  HEENT: Normal. Pupils equal. Neck: Supple. No mass.  No thyroid mass. Lymph Nodes:  No supraclavicular or cervical nodes. Lungs: Clear  to auscultation and symmetric breath sounds. Heart:  RRR. No murmur or rub. Abdomen: Soft. No mass. No tenderness. No hernia. Normal bowel sounds.   Rectal: Not done. Extremities:  Good strength and ROM  in upper and lower extremities. Neurologic:  Grossly intact to motor and sensory function. Psychiatric: Has normal mood and affect. Behavior is normal.   DATA REVIEWED: Epic notes  David Newman, MD,  FACS [facility] [address]  [address]   [place],     [address] Phone:  [phone] FAX:  [phone]  

## 2013-11-25 ENCOUNTER — Ambulatory Visit (HOSPITAL_COMMUNITY)
Admission: RE | Admit: 2013-11-25 | Discharge: 2013-11-25 | Disposition: A | Discharge status: Home / Self Care | Payer: No Typology Code available for payment source | Source: Ambulatory Visit | Attending: Surgery | Admitting: Surgery

## 2013-11-25 ENCOUNTER — Other Ambulatory Visit (INDEPENDENT_AMBULATORY_CARE_PROVIDER_SITE_OTHER): Payer: Self-pay | Admitting: Surgery

## 2013-11-25 DIAGNOSIS — E669 Obesity, unspecified: Secondary | ICD-10-CM | POA: Insufficient documentation

## 2013-11-25 DIAGNOSIS — Z9884 Bariatric surgery status: Secondary | ICD-10-CM | POA: Insufficient documentation

## 2013-12-13 ENCOUNTER — Encounter (INDEPENDENT_AMBULATORY_CARE_PROVIDER_SITE_OTHER): Payer: Self-pay

## 2014-01-05 ENCOUNTER — Ambulatory Visit: Payer: 59 | Admitting: Dietician

## 2014-12-11 ENCOUNTER — Other Ambulatory Visit: Payer: Self-pay | Admitting: Internal Medicine

## 2014-12-11 DIAGNOSIS — Z1239 Encounter for other screening for malignant neoplasm of breast: Secondary | ICD-10-CM

## 2014-12-15 ENCOUNTER — Ambulatory Visit: Payer: No Typology Code available for payment source

## 2014-12-26 ENCOUNTER — Ambulatory Visit
Admission: RE | Admit: 2014-12-26 | Discharge: 2014-12-26 | Disposition: A | Discharge status: Home / Self Care | Payer: 59 | Source: Ambulatory Visit | Attending: Internal Medicine | Admitting: Internal Medicine

## 2014-12-26 ENCOUNTER — Encounter (INDEPENDENT_AMBULATORY_CARE_PROVIDER_SITE_OTHER): Payer: Self-pay

## 2014-12-26 ENCOUNTER — Other Ambulatory Visit: Payer: Self-pay | Admitting: Internal Medicine

## 2014-12-26 DIAGNOSIS — Z1231 Encounter for screening mammogram for malignant neoplasm of breast: Secondary | ICD-10-CM

## 2014-12-27 ENCOUNTER — Other Ambulatory Visit: Payer: Self-pay | Admitting: *Deleted

## 2014-12-27 DIAGNOSIS — I83813 Varicose veins of bilateral lower extremities with pain: Secondary | ICD-10-CM

## 2015-01-26 ENCOUNTER — Encounter: Payer: Self-pay | Admitting: Vascular Surgery

## 2015-01-30 ENCOUNTER — Encounter: Payer: Self-pay | Admitting: Vascular Surgery

## 2015-01-30 ENCOUNTER — Ambulatory Visit (INDEPENDENT_AMBULATORY_CARE_PROVIDER_SITE_OTHER): Payer: 59 | Admitting: Vascular Surgery

## 2015-01-30 ENCOUNTER — Ambulatory Visit (HOSPITAL_COMMUNITY)
Admission: RE | Admit: 2015-01-30 | Discharge: 2015-01-30 | Disposition: A | Discharge status: Home / Self Care | Payer: 59 | Source: Ambulatory Visit | Attending: Vascular Surgery | Admitting: Vascular Surgery

## 2015-01-30 VITALS — BP 150/79 | HR 65 | Resp 16 | Ht 65.5 in | Wt 230.0 lb

## 2015-01-30 DIAGNOSIS — I8393 Asymptomatic varicose veins of bilateral lower extremities: Secondary | ICD-10-CM | POA: Diagnosis not present

## 2015-01-30 DIAGNOSIS — I83813 Varicose veins of bilateral lower extremities with pain: Secondary | ICD-10-CM | POA: Diagnosis not present

## 2015-01-30 DIAGNOSIS — I83893 Varicose veins of bilateral lower extremities with other complications: Secondary | ICD-10-CM

## 2015-01-30 DIAGNOSIS — I83899 Varicose veins of unspecified lower extremities with other complications: Secondary | ICD-10-CM | POA: Insufficient documentation

## 2015-01-30 NOTE — Progress Notes (Signed)
Filed Vitals:   01/30/15 1257 01/30/15 1258  BP: 161/82 150/79  Pulse: 58 65  Resp: 16 16  Height: 5' 5.5" (1.664 m)   Weight: 230 lb (104.327 kg)

## 2015-01-30 NOTE — Progress Notes (Signed)
Subjective:     Patient ID: Charlene BertinCynthia Capetillo, female   DOB: 05-18-1961, 54 y.o.   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 She has [facility] where she had laser ablation of the left great saphenous vein. She also also had some sclerotherapy at [facility] in the past. I saw her several years ago but shortly thereafter her husband became ill and she was unable to pursue further treatment at that time. She has been having aching throbbing burning and stinging discomfort in both legs with progressive edema and has been wearing long-leg elastic compression stockings 20-30 millimeter gradient and trying elevation and ibuprofen. Unfortunately she continues to have worsening symptoms quickly when she is on her feet. She has no history of DVT or thrombophlebitis or bleeding.  Past Medical History  Diagnosis Date  . Asthma   . Hypertension     History  Substance Use Topics  . Smoking status: Never Smoker   . Smokeless tobacco: Never Used  . Alcohol Use: No    History reviewed. No pertinent family history.  Allergies  Allergen Reactions  . Amoxicillin Itching     Current outpatient prescriptions:  .  PROAIR HFA 108 (90 BASE) MCG/ACT inhaler, , Disp: , Rfl:  .  valsartan-hydrochlorothiazide (DIOVAN-HCT) 160-12.5 MG per tablet, Take 1 tablet by mouth daily., Disp: , Rfl:   Filed Vitals:   01/30/15 1257 01/30/15 1258  BP: 161/82 150/79  Pulse: 58 65  Resp: 16 16  Height: 5' 5.5" (1.664 m)   Weight: 230 lb (104.327 kg)     Body mass index is 37.68 kg/(m^2).         Review of Systems denies chest pain, dyspnea on exertion, PND, orthopnea, hemoptysis.   does have leg pain with walking. Objective:   Physical Exam BP 150/79 mmHg  Pulse 65  Resp 16  Ht 5' 5.5" (1.664 m)  Wt 230 lb (104.327 kg)   BMI 37.68 kg/m2  Gen.-alert and oriented x3 in no apparent distress HEENT normal for age Lungs no rhonchi or wheezing Cardiovascular regular rhythm no murmurs carotid pulses 3+ palpable no bruits audible Abdomen soft nontender no palpable masses Musculoskeletal free of  major deformities Skin clear -no rashes Neurologic normal Lower extremities 3+ femoral and dorsalis pedis pulses palpable bilaterally with 1+ edema bilaterally Extensive reticular and spider veins throughout both lower extremities in the medial and lateral thighs and medial lateral calf. Early hyperpigmentation is present distally around the malleolus with chronic edema.  Today I ordered bilateral saphenous duplex exam which I reviewed and interpreted. Left leg reveals evidence of previous laser ablation left great saphenous vein. There is some reflux in the proximal most vein but it is small in caliber. Right leg however has a large proximal great saphenous vein with gross reflux applying the bulging varicosities. There is no DVT bilaterally.       Assessment:     Pain and swelling both lower extremities due to gross reflux right great saphenous vein which is treatable and gross reflux left great saphenous vein with small caliber vein.  Symptoms have been persistent despite optimal medical management including long-leg elastic compression stockings, elevation, and ibuprofen. Symptoms are affecting patient's daily living and becoming more severe    Plan:     Patient needs #1 laser ablation right great saphenous vein followed by #2  2courses of sclerotherapy right leg We'll proceed with precertification to perform this in the near future

## 2015-02-12 ENCOUNTER — Other Ambulatory Visit: Payer: Self-pay | Admitting: *Deleted

## 2015-02-12 DIAGNOSIS — I83811 Varicose veins of right lower extremities with pain: Secondary | ICD-10-CM

## 2015-02-28 ENCOUNTER — Encounter: Payer: Self-pay | Admitting: Vascular Surgery

## 2015-03-05 ENCOUNTER — Ambulatory Visit (INDEPENDENT_AMBULATORY_CARE_PROVIDER_SITE_OTHER): Payer: 59 | Admitting: Vascular Surgery

## 2015-03-05 ENCOUNTER — Encounter: Payer: Self-pay | Admitting: Vascular Surgery

## 2015-03-05 VITALS — BP 135/89 | HR 71 | Resp 16 | Ht 65.5 in | Wt 238.0 lb

## 2015-03-05 DIAGNOSIS — I83891 Varicose veins of right lower extremities with other complications: Secondary | ICD-10-CM | POA: Diagnosis not present

## 2015-03-05 DIAGNOSIS — I83899 Varicose veins of unspecified lower extremities with other complications: Secondary | ICD-10-CM | POA: Insufficient documentation

## 2015-03-05 NOTE — Progress Notes (Signed)
Subjective:     Patient ID: Charlene Pearson, female   DOB: 05/08/1961, 54 y.o.   MRN: [id number]  HPI this 54 year old female had laser ablation of the right great saphenous vein from the distal thigh to near the saphenofemoral junction performed under local tumescent anesthesia for gross reflux. A total of 1403 J of energy was utilized. She tolerated the procedure well.   Review of Systems     Objective:   Physical Exam BP 135/89 mmHg  Pulse 71  Resp 16  Ht 5' 5.5" (1.664 m)  Wt 238 lb (107.956 kg)  BMI 38.99 kg/m2       Assessment:     Well tolerated laser ablation right great saphenous vein performed under local tumescent anesthesia    Plan:     Return in 1 week for venous duplex exam to confirm closure right right saphenous vein. Patient will be scheduled for sclerotherapy in the near future

## 2015-03-05 NOTE — Progress Notes (Signed)
Laser Ablation Procedure    Date: 03/05/2015   Charlene Pearson DOB:1961-08-30  Consent signed: Yes    Surgeon:  Dr. Quita Skye. Hart Rochester  Procedure: Laser Ablation: right Greater Saphenous Vein   Dr. Hart Rochester  BP 135/89 mmHg  Pulse 71  Resp 16  Ht 5' 5.5" (1.664 m)  Wt 238 lb (107.956 kg)  BMI 38.99 kg/m2  Tumescent Anesthesia: 400 cc 0.9% NaCl with 50 cc Lidocaine HCL with 1% Epi and 15 cc 8.4% NaHCO3  Local Anesthesia: 3 cc Lidocaine HCL and NaHCO3 (ratio 2:1)  Pulsed Mode: 15 watts, delay, 1.0 duration  Total Energy:   1436           Total Pulses: 96               Total Time: 1:35    Patient tolerated procedure well  Notes:   Description of Procedure:  After marking the course of the secondary varicosities, the patient was placed on the operating table in the supine position, and the right leg was prepped and draped in sterile fashion.   Local anesthetic was administered and under ultrasound guidance the saphenous vein was accessed with a micro needle and guide wire; then the mirco puncture sheath was placed.  A guide wire was inserted saphenofemoral junction , followed by a 5 french sheath.  The position of the sheath and then the laser fiber below the junction was confirmed using the ultrasound.  Tumescent anesthesia was administered along the course of the saphenous vein using ultrasound guidance. The patient was placed in Trendelenburg position and protective laser glasses were placed on patient and staff, and the laser was fired at 15 watts continuous mode advancing 1-49mm/second for a total of 1436 joules.     Steri strips were applied to the stab wounds and ABD pads and thigh high compression stockings were applied.  Ace wrap bandages were applied over the phlebectomy sites and at the top of the saphenofemoral junction. Blood loss was less than 15 cc.  The patient ambulated out of the operating room having tolerated the procedure well.

## 2015-03-06 ENCOUNTER — Telehealth: Payer: Self-pay | Admitting: *Deleted

## 2015-03-06 ENCOUNTER — Encounter: Payer: Self-pay | Admitting: Vascular Surgery

## 2015-03-06 NOTE — Telephone Encounter (Signed)
Pt had some discomfort during the night but most of it was due to the stocking and ace wrap. She feels better this am. Following all instructions. Reminded her of her fu appts next Monday.

## 2015-03-12 ENCOUNTER — Encounter (HOSPITAL_COMMUNITY): Payer: 59

## 2015-03-12 ENCOUNTER — Ambulatory Visit (INDEPENDENT_AMBULATORY_CARE_PROVIDER_SITE_OTHER): Payer: 59 | Admitting: Vascular Surgery

## 2015-03-12 ENCOUNTER — Ambulatory Visit (HOSPITAL_COMMUNITY)
Admission: RE | Admit: 2015-03-12 | Discharge: 2015-03-12 | Disposition: A | Discharge status: Home / Self Care | Payer: 59 | Source: Ambulatory Visit | Attending: Vascular Surgery | Admitting: Vascular Surgery

## 2015-03-12 ENCOUNTER — Encounter: Payer: Self-pay | Admitting: Vascular Surgery

## 2015-03-12 ENCOUNTER — Ambulatory Visit: Payer: 59 | Admitting: Vascular Surgery

## 2015-03-12 VITALS — BP 134/88 | HR 69 | Resp 16 | Ht 65.5 in | Wt 238.0 lb

## 2015-03-12 DIAGNOSIS — I83811 Varicose veins of right lower extremities with pain: Secondary | ICD-10-CM | POA: Diagnosis not present

## 2015-03-12 DIAGNOSIS — I83891 Varicose veins of right lower extremities with other complications: Secondary | ICD-10-CM | POA: Diagnosis not present

## 2015-03-12 DIAGNOSIS — R590 Localized enlarged lymph nodes: Secondary | ICD-10-CM | POA: Insufficient documentation

## 2015-03-12 NOTE — Progress Notes (Signed)
Subjective:     Patient ID: Charlene Pearson, female   DOB: 1961/09/01, 54 y.o.   MRN: [id number]  HPI this 54 year old female returns 1 week post laser ablation right great saphenous vein for painful varicosities. This was performed under local tumescent anesthesia. She has noted an area of "firmness" in the proximal thigh where the ablation was performed. She has no history of DVT. She has worn her Lasix compression stocking and taken ibuprofen as instructed. The discomfort is decreasing. She's had no distal edema.  Past Medical History  Diagnosis Date  . Asthma   . Hypertension   . Varicose veins     History  Substance Use Topics  . Smoking status: Never Smoker   . Smokeless tobacco: Never Used  . Alcohol Use: No    History reviewed. No pertinent family history.  Allergies  Allergen Reactions  . Amoxicillin Itching     Current outpatient prescriptions:  .  Doxycycline Hyclate 200 MG TBEC, Take 100 mg by mouth 2 (two) times daily., Disp: , Rfl:  .  PROAIR HFA 108 (90 BASE) MCG/ACT inhaler, , Disp: , Rfl:  .  valsartan-hydrochlorothiazide (DIOVAN-HCT) 160-12.5 MG per tablet, Take 1 tablet by mouth daily., Disp: , Rfl:   Filed Vitals:   03/12/15 1032  BP: 134/88  Pulse: 69  Resp: 16  Height: 5' 5.5" (1.664 m)  Weight: 238 lb (107.956 kg)    Body mass index is 38.99 kg/(m^2).           Review of Systems denies chest pain, dyspnea on exertion, PND, orthopnea, hemoptysis, claudication.    Objective:   Physical Exam BP 134/88 mmHg  Pulse 69  Resp 16  Ht 5' 5.5" (1.664 m)  Wt 238 lb (107.956 kg)  BMI 38.99 kg/m2  Gen. well-developed well-nourished female no apparent distress alert and oriented 3 Lungs no rhonchi or wheezing Cardiovascular regular rhythm no murmurs Right leg with mild bruising in the proximal thigh. The great saphenous vein is palpable and mildly tender with no erythema. 3+ dorsalis pedis pulse palpable right leg. Diffuse scattered reticular  veins in medial thigh distally.  Today I ordered a venous duplex exam of the right leg which I reviewed and interpreted. There is no DVT. There is closure of the right great saphenous vein proximally.     Assessment:     Successful laser ablation right great saphenous vein supplying painful varicosities and reticular veins in right leg    Plan:     Return soon for sclerotherapy to complete patient's treatment regimen

## 2015-04-09 ENCOUNTER — Encounter: Payer: Self-pay | Admitting: *Deleted

## 2015-04-11 ENCOUNTER — Ambulatory Visit (INDEPENDENT_AMBULATORY_CARE_PROVIDER_SITE_OTHER): Payer: 59 | Admitting: *Deleted

## 2015-04-11 DIAGNOSIS — I83811 Varicose veins of right lower extremities with pain: Secondary | ICD-10-CM | POA: Diagnosis not present

## 2015-04-11 DIAGNOSIS — I83893 Varicose veins of bilateral lower extremities with other complications: Secondary | ICD-10-CM

## 2015-04-11 NOTE — Progress Notes (Signed)
X=.3% Sotradecol administered with a 27g butterfly.  Patient received a total of 25cc.  Extensive large sspider veins. Unable to treat all. She will return end of Aug for #2. Easy access. Tol well.  Photos: No.  Compression stockings applied: Yes.

## 2015-04-12 ENCOUNTER — Encounter: Payer: Self-pay | Admitting: Vascular Surgery

## 2015-05-15 ENCOUNTER — Encounter: Payer: Self-pay | Admitting: *Deleted

## 2015-05-16 ENCOUNTER — Ambulatory Visit (INDEPENDENT_AMBULATORY_CARE_PROVIDER_SITE_OTHER): Payer: 59 | Admitting: *Deleted

## 2015-05-16 DIAGNOSIS — I83811 Varicose veins of right lower extremities with pain: Secondary | ICD-10-CM | POA: Diagnosis not present

## 2015-05-16 DIAGNOSIS — I83893 Varicose veins of bilateral lower extremities with other complications: Secondary | ICD-10-CM

## 2015-05-17 ENCOUNTER — Encounter: Payer: Self-pay | Admitting: Vascular Surgery

## 2015-05-18 NOTE — Progress Notes (Signed)
X=No results found for: HIV1RNAQUANT% Sotradecol administered with a 27g butterfly.  Patient received a total of 24cc.  Treated all remaining areas. Tol well. Anticipate good results. It will take time for all the hard areas to resolve though. Follow prn.  Photos: No.  Compression stockings applied: Yes.

## 2016-06-17 ENCOUNTER — Telehealth: Payer: Self-pay | Admitting: *Deleted

## 2016-06-17 NOTE — Telephone Encounter (Signed)
Pt called because she has what sounds like staining from her sclerotherapy tx that was done after her laser ablation. It has been a year and she says the legs look worse than they did before the tx. I told her it sounds like the iron from her blood deposited in the skin causing staining which usually goes away by 1 year. Since it has been a year, she probably will not see improvement. I told her there is no treatment for this and more sclerotherapy would probably just make things worse. Pt is disappointed but expressed understanding. Follow prn.

## 2016-10-29 ENCOUNTER — Encounter (HOSPITAL_COMMUNITY): Payer: Self-pay

## 2017-05-04 ENCOUNTER — Other Ambulatory Visit: Payer: Self-pay | Admitting: Internal Medicine

## 2017-05-04 ENCOUNTER — Ambulatory Visit
Admission: RE | Admit: 2017-05-04 | Discharge: 2017-05-04 | Disposition: A | Discharge status: Home / Self Care | Payer: Self-pay | Source: Ambulatory Visit | Attending: Internal Medicine | Admitting: Internal Medicine

## 2017-05-04 DIAGNOSIS — M79642 Pain in left hand: Secondary | ICD-10-CM

## 2017-10-28 ENCOUNTER — Encounter (HOSPITAL_COMMUNITY): Payer: Self-pay

## 2019-04-07 ENCOUNTER — Emergency Department (HOSPITAL_COMMUNITY): Payer: BLUE CROSS/BLUE SHIELD

## 2019-04-07 ENCOUNTER — Encounter (HOSPITAL_COMMUNITY): Payer: Self-pay | Admitting: Emergency Medicine

## 2019-04-07 ENCOUNTER — Inpatient Hospital Stay (HOSPITAL_COMMUNITY)
Admission: EM | Admit: 2019-04-07 | Discharge: 2019-04-12 | DRG: 177 | Disposition: A | Discharge status: Home / Self Care | Payer: BLUE CROSS/BLUE SHIELD | Attending: Internal Medicine | Admitting: Internal Medicine

## 2019-04-07 ENCOUNTER — Other Ambulatory Visit: Payer: Self-pay

## 2019-04-07 DIAGNOSIS — J1289 Other viral pneumonia: Secondary | ICD-10-CM | POA: Diagnosis present

## 2019-04-07 DIAGNOSIS — J069 Acute upper respiratory infection, unspecified: Secondary | ICD-10-CM

## 2019-04-07 DIAGNOSIS — I8393 Asymptomatic varicose veins of bilateral lower extremities: Secondary | ICD-10-CM | POA: Diagnosis present

## 2019-04-07 DIAGNOSIS — J9601 Acute respiratory failure with hypoxia: Secondary | ICD-10-CM | POA: Diagnosis not present

## 2019-04-07 DIAGNOSIS — J45909 Unspecified asthma, uncomplicated: Secondary | ICD-10-CM | POA: Diagnosis present

## 2019-04-07 DIAGNOSIS — Z9884 Bariatric surgery status: Secondary | ICD-10-CM | POA: Diagnosis not present

## 2019-04-07 DIAGNOSIS — D509 Iron deficiency anemia, unspecified: Secondary | ICD-10-CM | POA: Diagnosis present

## 2019-04-07 DIAGNOSIS — I10 Essential (primary) hypertension: Secondary | ICD-10-CM

## 2019-04-07 DIAGNOSIS — I83893 Varicose veins of bilateral lower extremities with other complications: Secondary | ICD-10-CM | POA: Diagnosis not present

## 2019-04-07 DIAGNOSIS — M7989 Other specified soft tissue disorders: Secondary | ICD-10-CM | POA: Diagnosis not present

## 2019-04-07 DIAGNOSIS — U071 COVID-19: Secondary | ICD-10-CM | POA: Diagnosis not present

## 2019-04-07 DIAGNOSIS — J9621 Acute and chronic respiratory failure with hypoxia: Secondary | ICD-10-CM | POA: Diagnosis present

## 2019-04-07 DIAGNOSIS — I83899 Varicose veins of unspecified lower extremities with other complications: Secondary | ICD-10-CM | POA: Diagnosis present

## 2019-04-07 LAB — CBC WITH DIFFERENTIAL/PLATELET
Abs Immature Granulocytes: 0.08 10*3/uL — ABNORMAL HIGH (ref 0.00–0.07)
Basophils Absolute: 0 10*3/uL (ref 0.0–0.1)
Basophils Relative: 0 %
Eosinophils Absolute: 0 10*3/uL (ref 0.0–0.5)
Eosinophils Relative: 0 %
HCT: 32.6 % — ABNORMAL LOW (ref 36.0–46.0)
Hemoglobin: 10.1 g/dL — ABNORMAL LOW (ref 12.0–15.0)
Immature Granulocytes: 1 %
Lymphocytes Relative: 13 %
Lymphs Abs: 1 10*3/uL (ref 0.7–4.0)
MCH: 23.6 pg — ABNORMAL LOW (ref 26.0–34.0)
MCHC: 31 g/dL (ref 30.0–36.0)
MCV: 76.2 fL — ABNORMAL LOW (ref 80.0–100.0)
Monocytes Absolute: 0.5 10*3/uL (ref 0.1–1.0)
Monocytes Relative: 6 %
Neutro Abs: 5.9 10*3/uL (ref 1.7–7.7)
Neutrophils Relative %: 80 %
Platelets: 214 10*3/uL (ref 150–400)
RBC: 4.28 MIL/uL (ref 3.87–5.11)
RDW: 16.8 % — ABNORMAL HIGH (ref 11.5–15.5)
WBC: 7.5 10*3/uL (ref 4.0–10.5)
nRBC: 0 % (ref 0.0–0.2)

## 2019-04-07 LAB — LACTATE DEHYDROGENASE: LDH: 355 U/L — ABNORMAL HIGH (ref 98–192)

## 2019-04-07 LAB — LACTIC ACID, PLASMA: Lactic Acid, Venous: 0.9 mmol/L (ref 0.5–1.9)

## 2019-04-07 LAB — COMPREHENSIVE METABOLIC PANEL
ALT: 27 U/L (ref 0–44)
AST: 62 U/L — ABNORMAL HIGH (ref 15–41)
Albumin: 3.3 g/dL — ABNORMAL LOW (ref 3.5–5.0)
Alkaline Phosphatase: 63 U/L (ref 38–126)
Anion gap: 13 (ref 5–15)
BUN: 10 mg/dL (ref 6–20)
CO2: 24 mmol/L (ref 22–32)
Calcium: 8.2 mg/dL — ABNORMAL LOW (ref 8.9–10.3)
Chloride: 103 mmol/L (ref 98–111)
Creatinine, Ser: 0.61 mg/dL (ref 0.44–1.00)
GFR calc Af Amer: >60 mL/min (ref >60)
GFR calc non Af Amer: >60 mL/min (ref >60)
Glucose, Bld: 101 mg/dL — ABNORMAL HIGH (ref 70–99)
Potassium: 3.4 mmol/L — ABNORMAL LOW (ref 3.5–5.1)
Sodium: 140 mmol/L (ref 135–145)
Total Bilirubin: 0.5 mg/dL (ref 0.3–1.2)
Total Protein: 7.5 g/dL (ref 6.5–8.1)

## 2019-04-07 LAB — TRIGLYCERIDES: Triglycerides: 144 mg/dL (ref <150)

## 2019-04-07 LAB — FIBRINOGEN: Fibrinogen: >800 mg/dL — ABNORMAL HIGH (ref 210–475)

## 2019-04-07 LAB — FERRITIN: Ferritin: 100 ng/mL (ref 11–307)

## 2019-04-07 LAB — ABO/RH: ABO/RH(D): A POS

## 2019-04-07 LAB — PROCALCITONIN: Procalcitonin: <0.1 ng/mL

## 2019-04-07 LAB — D-DIMER, QUANTITATIVE: D-Dimer, Quant: 1.16 ug/mL-FEU — ABNORMAL HIGH (ref 0.00–0.50)

## 2019-04-07 LAB — C-REACTIVE PROTEIN: CRP: 14.3 mg/dL — ABNORMAL HIGH (ref <1.0)

## 2019-04-07 MED ORDER — ONDANSETRON HCL 4 MG PO TABS: 4.0000 mg | ORAL_TABLET | Freq: Four times a day (QID) | ORAL | Status: DC | PRN

## 2019-04-07 MED ORDER — HYDROCODONE-ACETAMINOPHEN 5-325 MG PO TABS: 1.0000 | ORAL_TABLET | ORAL | Status: DC | PRN

## 2019-04-07 MED ORDER — ACETAMINOPHEN 325 MG PO TABS: 650.0000 mg | ORAL_TABLET | Freq: Four times a day (QID) | ORAL | Status: DC | PRN

## 2019-04-07 MED ORDER — HYDROCOD POLST-CPM POLST ER 10-8 MG/5ML PO SUER
5.0000 mL | Freq: Two times a day (BID) | ORAL | Status: DC | PRN
  Administered 2019-04-07 – 2019-04-08 (×2): 5 mL via ORAL
  Filled 2019-04-07 (×2): qty 5

## 2019-04-07 MED ORDER — TOCILIZUMAB 400 MG/20ML IV SOLN
800.0000 mg | Freq: Once | INTRAVENOUS | Status: AC
  Administered 2019-04-07: 800 mg via INTRAVENOUS
  Filled 2019-04-07: qty 40

## 2019-04-07 MED ORDER — ONDANSETRON HCL 4 MG/2ML IJ SOLN: 4.0000 mg | Freq: Four times a day (QID) | INTRAMUSCULAR | Status: DC | PRN

## 2019-04-07 MED ORDER — ENOXAPARIN SODIUM 60 MG/0.6ML ~~LOC~~ SOLN
50.0000 mg | SUBCUTANEOUS | Status: DC
  Administered 2019-04-07 – 2019-04-11 (×5): 50 mg via SUBCUTANEOUS
  Filled 2019-04-07 (×5): qty 0.6

## 2019-04-07 MED ORDER — POLYETHYLENE GLYCOL 3350 17 G PO PACK: 17.0000 g | PACK | Freq: Every day | ORAL | Status: DC | PRN

## 2019-04-07 MED ORDER — VITAMIN C 500 MG PO TABS
500.0000 mg | ORAL_TABLET | Freq: Every day | ORAL | Status: DC
  Administered 2019-04-07 – 2019-04-12 (×6): 500 mg via ORAL
  Filled 2019-04-07 (×6): qty 1

## 2019-04-07 MED ORDER — DEXAMETHASONE SODIUM PHOSPHATE 10 MG/ML IJ SOLN
10.0000 mg | Freq: Once | INTRAMUSCULAR | Status: AC
  Administered 2019-04-07: 10 mg via INTRAVENOUS
  Filled 2019-04-07: qty 1

## 2019-04-07 MED ORDER — DEXAMETHASONE 6 MG PO TABS
6.0000 mg | ORAL_TABLET | Freq: Every day | ORAL | Status: DC
  Administered 2019-04-08 – 2019-04-12 (×5): 6 mg via ORAL
  Filled 2019-04-07 (×5): qty 1

## 2019-04-07 MED ORDER — SODIUM CHLORIDE 0.9 % IV SOLN
100.0000 mg | INTRAVENOUS | Status: AC
  Administered 2019-04-08 – 2019-04-11 (×4): 100 mg via INTRAVENOUS
  Filled 2019-04-07 (×4): qty 20

## 2019-04-07 MED ORDER — GUAIFENESIN 100 MG/5ML PO SOLN
5.0000 mL | ORAL | Status: DC | PRN
  Administered 2019-04-07 – 2019-04-10 (×9): 100 mg via ORAL
  Filled 2019-04-07 (×8): qty 15

## 2019-04-07 MED ORDER — METHYLPREDNISOLONE SODIUM SUCC 40 MG IJ SOLR: 40.0000 mg | Freq: Two times a day (BID) | INTRAMUSCULAR | Status: DC

## 2019-04-07 MED ORDER — SODIUM CHLORIDE 0.9 % IV SOLN
200.0000 mg | Freq: Once | INTRAVENOUS | Status: AC
  Administered 2019-04-07: 200 mg via INTRAVENOUS
  Filled 2019-04-07: qty 40

## 2019-04-07 MED ORDER — IPRATROPIUM-ALBUTEROL 20-100 MCG/ACT IN AERS
1.0000 | INHALATION_SPRAY | Freq: Four times a day (QID) | RESPIRATORY_TRACT | Status: DC
  Administered 2019-04-07 – 2019-04-12 (×21): 1 via RESPIRATORY_TRACT
  Filled 2019-04-07: qty 4

## 2019-04-07 MED ORDER — ZINC SULFATE 220 (50 ZN) MG PO CAPS
220.0000 mg | ORAL_CAPSULE | Freq: Every day | ORAL | Status: DC
  Administered 2019-04-07 – 2019-04-12 (×6): 220 mg via ORAL
  Filled 2019-04-07 (×6): qty 1

## 2019-04-07 NOTE — Plan of Care (Signed)
  Problem: Education: Goal: Knowledge of risk factors and measures for prevention of condition will improve Outcome: Progressing   Problem: Coping: Goal: Psychosocial and spiritual needs will be supported Outcome: Progressing   Problem: Respiratory: Goal: Will maintain a patent airway Outcome: Progressing Goal: Complications related to the disease process, condition or treatment will be avoided or minimized Outcome: Progressing   

## 2019-04-07 NOTE — ED Notes (Addendum)
This Probation officer called [facility] for transport of patient. [facility] ETA is 2 hours. Floor Nurse Ciara RN [phone]) asked this writer to make her aware when [facility] has arrived for transport of patient to [facility].

## 2019-04-07 NOTE — ED Triage Notes (Signed)
Arrives via EMS from home, patient reports to being COVID + 12 days ago, and was diagnosed with :a touch of pneumonia" as well. Patient has 2 days left of her abx. Dry cough noted. CBG 124

## 2019-04-07 NOTE — ED Provider Notes (Signed)
[facility] DEPT Provider Note   CSN: [id number] Arrival date & time: 04/07/19  0537    History   Chief Complaint Chief Complaint  Patient presents with  . Shortness of Breath    HPI Charlene Pearson is a 58 y.o. female with a history of hypertension, asthma, varicose veins of the bilateral lower extremities, and Roux-en-Y gastric bypass in 2009 who presents to the emergency department with a chief complaint of shortness of breath.  The patient endorses constant, worsening shortness of breath for the last 2 days.  She also reports associated burning in her chest that she attributes to the albuterol inhaler that she has been using.  She reports that she has been having persistent fevers greater than 101 at home for the last few days.  She also notes that she has been having pain in right calf for the last 2 days.  She reports that she has been having fever, mild symptoms and persistent, nonproductive cough for the last 10 days with gradual worsening since onset until 2 days ago.   She reports that she was seen at [facility] in the ER on July 13 and was started on Tessalon Perles, azithromycin, guaifenesin.  She reports that she was diagnosed with "a touch of pneumonia" during her ER visit.  She reports that she is also been treating her fevers by alternating Tylenol and ibuprofen.  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  She denies vomiting, abdominal pain, rash, abdominal pain, palpitations, or visual changes.      No language interpreter was used.    Past Medical History:  Diagnosis Date  . Asthma   . Hypertension   . Varicose veins     Patient Active Problem List   Diagnosis Date Noted  . COVID-19 virus infection 04/07/2019  . Varicose veins of leg with complications 13/04/6577  . Varicose veins of lower  extremities with complications 46/96/2952  . 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 ROUX-EN-Y PROCEDURE  2009     OB History   No obstetric history on file.      Home Medications    Prior to Admission medications   Medication Sig Start Date End Date Taking? Authorizing Provider  Doxycycline Hyclate 200 MG TBEC Take 100 mg by mouth 2 (two) times daily.    [provider]  PROAIR HFA 108 (90 BASE) MCG/ACT inhaler  08/22/13   [provider]  valsartan-hydrochlorothiazide (DIOVAN-HCT) 160-12.5 MG per tablet Take 1 tablet by mouth daily.    [provider]    Family History No family history on file.  Social History Social History   Tobacco Use  . Smoking status: Never Smoker  . Smokeless tobacco: Never Used  Substance Use Topics  . Alcohol use: No  . Drug use: No     Allergies   Amoxicillin   Review of Systems Review of Systems  Constitutional: Positive for fever. Negative for activity change and chills.  HENT: Negative for congestion.   Respiratory: Positive for cough and shortness of breath.   Cardiovascular: Positive for chest pain and leg swelling. Negative for palpitations.  Gastrointestinal: Positive for nausea. Negative for abdominal pain, blood in stool, diarrhea and vomiting.  Genitourinary: Negative for dysuria, frequency and vaginal bleeding.  Musculoskeletal: Positive for arthralgias  and myalgias. Negative for back pain, gait problem, neck pain and neck stiffness.  Skin: Negative for rash.  Allergic/Immunologic: Negative for immunocompromised state.  Neurological: Negative for dizziness, weakness, numbness and headaches.  Psychiatric/Behavioral: Negative for confusion.     Physical Exam Updated Vital Signs BP 110/68 (BP Location: Left Arm)   Pulse 77   Temp 99.5 F (37.5 C) (Oral)   Resp (!) 30   SpO2 94%   Physical Exam Vitals signs and  nursing note reviewed.  Constitutional:      General: She is in acute distress.     Appearance: She is not ill-appearing, toxic-appearing or diaphoretic.  HENT:     Head: Normocephalic.  Eyes:     Conjunctiva/sclera: Conjunctivae normal.  Neck:     Musculoskeletal: Normal range of motion and neck supple.  Cardiovascular:     Rate and Rhythm: Normal rate and regular rhythm.     Pulses: Normal pulses.     Heart sounds: Normal heart sounds. No murmur. No friction rub. No gallop.   Pulmonary:     Effort: Respiratory distress present.     Breath sounds: No stridor. No wheezing, rhonchi or rales.  Abdominal:     General: Bowel sounds are normal. There is no distension.     Palpations: Abdomen is soft. There is no mass.     Tenderness: There is no abdominal tenderness. There is no right CVA tenderness, left CVA tenderness, guarding or rebound.     Hernia: No hernia is present.  Musculoskeletal:     Comments: TTP to the right calf. Multiple varicose veins noted to the bilateral lower extremities. DP pulses are 2+ and symmetric. No left calf tenderness.   Skin:    General: Skin is warm.     Capillary Refill: Capillary refill takes less than 2 seconds.     Findings: No rash.  Neurological:     Mental Status: She is alert.  Psychiatric:        Behavior: Behavior normal.      ED Treatments / Results  Labs (all labs ordered are listed, but only abnormal results are displayed) Labs Reviewed  CBC WITH DIFFERENTIAL/PLATELET - Abnormal; Notable for the following components:      Result Value   Hemoglobin 10.1 (*)    HCT 32.6 (*)    MCV 76.2 (*)    MCH 23.6 (*)    RDW 16.8 (*)    Abs Immature Granulocytes 0.08 (*)    All other components within normal limits  COMPREHENSIVE METABOLIC PANEL - Abnormal; Notable for the following components:   Potassium 3.4 (*)    Glucose, Bld 101 (*)    Calcium 8.2 (*)    Albumin 3.3 (*)    AST 62 (*)    All other components within normal limits   LACTATE DEHYDROGENASE - Abnormal; Notable for the following components:   LDH 355 (*)    All other components within normal limits  D-DIMER, QUANTITATIVE (NOT AT [facility]) - Abnormal; Notable for the following components:   D-Dimer, Quant 1.16 (*)    All other components within normal limits  CULTURE, BLOOD (ROUTINE X 2)  CULTURE, BLOOD (ROUTINE X 2)  LACTIC ACID, PLASMA  LACTIC ACID, PLASMA  PROCALCITONIN  FERRITIN  TRIGLYCERIDES  FIBRINOGEN  C-REACTIVE PROTEIN    EKG EKG Interpretation  Date/Time:  Thursday April 07 2019 05:59:30 EDT Ventricular Rate:  78 PR Interval:    QRS Duration: 90 QT Interval:  380 QTC Calculation: 433  R Axis:   11 Text Interpretation:  Sinus rhythm Confirmed by Palumbo, April ([id number]) on 04/07/2019 6:09:08 AM   Radiology Dg Chest Port 1 View  Result Date: 04/07/2019 CLINICAL DATA:  [id number] of breath.  COVID-19 positive EXAM: PORTABLE CHEST 1 VIEW COMPARISON:  08/16/2017 FINDINGS: Low volume chest with bilateral pulmonary infiltrate and probable superimposed atelectasis. No edema, effusion, or pneumothorax. Normal heart size accounting for rotation and low volumes. IMPRESSION: Bilateral pneumonia correlating with history of COVID-19. Low lung volumes. Electronically Signed   By: Marnee SpringJonathon  Watts M.D.   On: 04/07/2019 06:42    Procedures Procedures (including critical care time)  Medications Ordered in ED Medications  dexamethasone (DECADRON) injection 10 mg (10 mg Intravenous Given 04/07/19 0620)     Initial Impression / Assessment and Plan / ED Course  I have reviewed the triage vital signs and the nursing notes.  Pertinent labs & imaging results that were available during my care of the patient were reviewed by me and considered in my medical decision making (see chart for details).        58 year old female with a history of hypertension, asthma, varicose veins of the bilateral lower extremities, and Roux-en-Y gastric bypass in 2009  presenting with shortness of breath, fever, and nonproductive cough for the last 10 days.  Symptoms significantly worsened over the last 2 days.  The patient was seen and evaluated while wearing a proper PPE, occluding a facial shield and N-95 respirator.  On arrival to the ER, the patient is tachypneic and breathing 37 times per minute.  Initial sats are 93% on room air, but dipped to 88-89%. She was initially placed on [place] and later high flow nasal cannula for comfort.  Positive COVID-19 test is available in results under care everywhere.  Afebrile in the ER.  She is not tachycardic.  Normotensive.  Chest x-ray with bilateral pulmonary infiltrates correlating with history of COVID-19 and low lung volumes.  The patient was discussed with Dr. Pollie MeyerPalombo Rasch, attending physician.  She was given IV Decadron. Consult to the hospitalist for admission and spoke with Dr. Selena BattenKim who will accept the patient.  He is requested a DVT study of the right calf, which has been ordered. Labs for admission have been ordered and are pending.  The patient appears reasonably stabilized for admission considering the current resources, flow, and capabilities available in the ED at this time, and I doubt any other [facility] requiring further screening and/or treatment in the ED prior to admission.   Final Clinical Impressions(s) / ED Diagnoses   Final diagnoses:  Acute respiratory disease due to COVID-19 virus    ED Discharge Orders    None       Barkley BoardsMcDonald, Atticus Wedin A, PA-C 04/07/19 0651    Palumbo, April, MD 04/11/19 [id number]

## 2019-04-07 NOTE — Progress Notes (Signed)
Right lower extremity venous duplex has been completed. Preliminary results can be found in CV Proc through chart review.  Results were given to [facility] PA.  04/07/19 8:47 AM Carlos Levering RVT

## 2019-04-07 NOTE — H&P (Signed)
History and Physical  Charlene HedgesCynthia Pearson XBM:[id number] DOB: July 09, 1961 DOA: 04/07/2019   Patient coming from: Home & is able to ambulate  Chief Complaint: SOB, cough  HPI: Charlene BeechamCynthia Pearson is a 58 y.o. 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  Patient reports worsening shortness of breath, nonproductive cough, chest discomfort, as well as fever of about 101 at home for the past 3 days.  Patient also reports having pain in her right calf for the last 2 days.  Patient denies any abdominal pain, diarrhea, vomiting, headaches.  Of note, patient was [facility] at [facility] on 04/04/2019 where she tested positive for COVID and was discharged on Z-Pak, cough medication and inhalers.  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   ED Course: Patient was noted to be tachypneic, with saturation around 88 to 89% on room air, placed on nasal cannula, saturating above 90.  Afebrile, with no leukocytosis.  Inflammatory markers elevated, with CRP 14.3, negative procalcitonin, d-dimer elevated at 1.16.  Chest x-ray showed bilateral infiltrates.  Patient was given IV Decadron, and to start Actemra and remdisivir here in the ER as soon as possible.  Review of Systems: Review of systems are otherwise negative   Past Medical History:  Diagnosis Date   Asthma    Hypertension    Varicose veins    Past Surgical History:  Procedure Laterality Date   ROUX-EN-Y PROCEDURE  2009    Social History:  reports that she has never smoked. She has never used smokeless tobacco. She reports that she does not drink alcohol or use drugs.   Allergies  Allergen  Reactions   Amoxicillin Itching    No family history on file.    Prior to Admission medications   Medication Sig Start Date End Date Taking? Authorizing Provider  Doxycycline Hyclate 200 MG TBEC Take 100 mg by mouth 2 (two) times daily.    [provider]  PROAIR HFA 108 (90 BASE) MCG/ACT inhaler  08/22/13   [provider]  valsartan-hydrochlorothiazide (DIOVAN-HCT) 160-12.5 MG per tablet Take 1 tablet by mouth daily.    [provider]    Physical Exam: BP 123/70    Pulse 80    Temp 99.5 F (37.5 C) (Oral)    Resp (!) 27    Wt 109 kg    SpO2 96%    BMI 39.38 kg/m   General: Mild distress, ill looking Eyes: Normal ENT: Normal Neck: Supple Cardiovascular: S1, S2 present Respiratory: CTA B Abdomen: Soft, nontender, nondistended, bowel sounds present Skin: Multiple varicose veins noted bilaterally Musculoskeletal: TTP to right cough, no pedal edema bilaterally Psychiatric: Normal mood Neurologic: No focal neurologic deficit noted          Labs on Admission:  Basic Metabolic Panel: Recent Labs  Lab 04/07/19 0613  NA 140  K 3.4*  CL 103  CO2 24  GLUCOSE 101*  BUN 10  CREATININE 0.61  CALCIUM 8.2*   Liver Function Tests: Recent Labs  Lab 04/07/19 0613  AST 62*  ALT 27  ALKPHOS 63  BILITOT 0.5  PROT 7.5  ALBUMIN 3.3*   No results for input(s): LIPASE, AMYLASE in the last  168 hours. No results for input(s): AMMONIA in the last 168 hours. CBC: Recent Labs  Lab 04/07/19 0613  WBC 7.5  NEUTROABS 5.9  HGB 10.1*  HCT 32.6*  MCV 76.2*  PLT 214   Cardiac Enzymes: No results for input(s): CKTOTAL, CKMB, CKMBINDEX, TROPONINI in the last 168 hours.  BNP (last 3 results) No results for input(s): BNP in the last 8760 hours.  ProBNP (last 3 results) No results for input(s): PROBNP in the last 8760 hours.  CBG: No results for input(s): GLUCAP in the last 168 hours.  Radiological Exams on Admission: Dg Chest Port 1  View  Result Date: 04/07/2019 CLINICAL DATA:  Shortness of breath.  COVID-19 positive EXAM: PORTABLE CHEST 1 VIEW COMPARISON:  08/16/2017 FINDINGS: Low volume chest with bilateral pulmonary infiltrate and probable superimposed atelectasis. No edema, effusion, or pneumothorax. Normal heart size accounting for rotation and low volumes. IMPRESSION: Bilateral pneumonia correlating with history of COVID-19. Low lung volumes. Electronically Signed   By: Monte Fantasia M.D.   On: 04/07/2019 06:42   Vas [place] Lower Extremity Venous (dvt) (only Mc & Wl)  Result Date: 04/07/2019  Lower Venous Study Indications: Swelling.  Risk Factors: COVID 19 positive. Comparison Study: No prior studies Performing Technologist: Oliver Hum RVT  Examination Guidelines: A complete evaluation includes B-mode imaging, spectral Doppler, color Doppler, and power Doppler as needed of all accessible portions of each vessel. Bilateral testing is considered an integral part of a complete examination. Limited examinations for reoccurring indications may be performed as noted.  +---------+---------------+---------+-----------+----------+-------+  RIGHT     Compressibility Phasicity Spontaneity Properties Summary  +---------+---------------+---------+-----------+----------+-------+  CFV       Full            Yes       Yes                             +---------+---------------+---------+-----------+----------+-------+  SFJ       Full                                                      +---------+---------------+---------+-----------+----------+-------+  FV Prox   Full                                                      +---------+---------------+---------+-----------+----------+-------+  FV Mid    Full                                                      +---------+---------------+---------+-----------+----------+-------+  FV Distal Full                                                       +---------+---------------+---------+-----------+----------+-------+  PFV       Full                                                      +---------+---------------+---------+-----------+----------+-------+  POP       Full            Yes       Yes                             +---------+---------------+---------+-----------+----------+-------+  PTV       Full                                                      +---------+---------------+---------+-----------+----------+-------+  PERO      Full                                                      +---------+---------------+---------+-----------+----------+-------+   +----+---------------+---------+-----------+----------+-------+  LEFT Compressibility Phasicity Spontaneity Properties Summary  +----+---------------+---------+-----------+----------+-------+  CFV  Full            Yes       Yes                             +----+---------------+---------+-----------+----------+-------+     Summary: Right: There is no evidence of deep vein thrombosis in the lower extremity. No cystic structure found in the popliteal fossa. Left: No evidence of common femoral vein obstruction.  *See table(s) above for measurements and observations.    Preliminary     EKG: Independently reviewed.  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   Varicose veins of lower extremities with complications   Asthma   Hypertension  Pneumonia 2/2 COVID-19 virus Currently afebrile, with no leukocytosis Currently requiring about 3 L of oxygen, saturating above 90 Inflammatory markers elevated including CRP 14.3, procalcitonin negative, LA negative D-dimer elevated at 1.16 Chest x-ray with bilateral infiltrates IV Decadron 10 mg given Inhalers, supplemental oxygen Start Actemra and  remdisivir ASAP Plan to [facility] for [facility] management  Right calf tenderness Preliminary bilateral venous Doppler negative for DVT  Hypertension BP stable Hold home valsartan-hydrochlorothiazide for now  Microcytic anemia Likely 2/2 iron deficiency, denies any active bleeding Anemia panel in the am Daily CBC     DVT prophylaxis: Lovenox  Code Status: Full  Family Communication: None at bedside  Disposition Plan: To be determined  Consults called: None  Admission status: Inpatient    Briant CedarNkeiruka J Birt Reinoso MD [facility]   If 7PM-7AM, please contact night-coverage [email or web address]  04/07/2019, 9:06 AM

## 2019-04-07 NOTE — ED Notes (Addendum)
This Probation officer is waiting for medications to be sent down from pharmacy in order to complete medication administration.

## 2019-04-08 DIAGNOSIS — J9601 Acute respiratory failure with hypoxia: Secondary | ICD-10-CM | POA: Insufficient documentation

## 2019-04-08 DIAGNOSIS — I10 Essential (primary) hypertension: Secondary | ICD-10-CM

## 2019-04-08 DIAGNOSIS — U071 COVID-19: Principal | ICD-10-CM

## 2019-04-08 DIAGNOSIS — J9621 Acute and chronic respiratory failure with hypoxia: Secondary | ICD-10-CM

## 2019-04-08 LAB — D-DIMER, QUANTITATIVE: D-Dimer, Quant: 1.76 ug/mL-FEU — ABNORMAL HIGH (ref 0.00–0.50)

## 2019-04-08 LAB — CBC WITH DIFFERENTIAL/PLATELET
Abs Immature Granulocytes: 0.13 10*3/uL — ABNORMAL HIGH (ref 0.00–0.07)
Basophils Absolute: 0 10*3/uL (ref 0.0–0.1)
Basophils Relative: 0 %
Eosinophils Absolute: 0 10*3/uL (ref 0.0–0.5)
Eosinophils Relative: 0 %
HCT: 31.8 % — ABNORMAL LOW (ref 36.0–46.0)
Hemoglobin: 9.9 g/dL — ABNORMAL LOW (ref 12.0–15.0)
Immature Granulocytes: 2 %
Lymphocytes Relative: 18 %
Lymphs Abs: 1 10*3/uL (ref 0.7–4.0)
MCH: 23.5 pg — ABNORMAL LOW (ref 26.0–34.0)
MCHC: 31.1 g/dL (ref 30.0–36.0)
MCV: 75.4 fL — ABNORMAL LOW (ref 80.0–100.0)
Monocytes Absolute: 0.6 10*3/uL (ref 0.1–1.0)
Monocytes Relative: 11 %
Neutro Abs: 3.7 10*3/uL (ref 1.7–7.7)
Neutrophils Relative %: 69 %
Platelets: 242 10*3/uL (ref 150–400)
RBC: 4.22 MIL/uL (ref 3.87–5.11)
RDW: 16.5 % — ABNORMAL HIGH (ref 11.5–15.5)
WBC: 5.5 10*3/uL (ref 4.0–10.5)
nRBC: 0 % (ref 0.0–0.2)

## 2019-04-08 LAB — COMPREHENSIVE METABOLIC PANEL
ALT: 34 U/L (ref 0–44)
AST: 68 U/L — ABNORMAL HIGH (ref 15–41)
Albumin: 3 g/dL — ABNORMAL LOW (ref 3.5–5.0)
Alkaline Phosphatase: 65 U/L (ref 38–126)
Anion gap: 13 (ref 5–15)
BUN: 14 mg/dL (ref 6–20)
CO2: 23 mmol/L (ref 22–32)
Calcium: 8.5 mg/dL — ABNORMAL LOW (ref 8.9–10.3)
Chloride: 105 mmol/L (ref 98–111)
Creatinine, Ser: 0.63 mg/dL (ref 0.44–1.00)
GFR calc Af Amer: >60 mL/min (ref >60)
GFR calc non Af Amer: >60 mL/min (ref >60)
Glucose, Bld: 132 mg/dL — ABNORMAL HIGH (ref 70–99)
Potassium: 3.7 mmol/L (ref 3.5–5.1)
Sodium: 141 mmol/L (ref 135–145)
Total Bilirubin: 0.2 mg/dL — ABNORMAL LOW (ref 0.3–1.2)
Total Protein: 7.3 g/dL (ref 6.5–8.1)

## 2019-04-08 LAB — IRON AND TIBC
Iron: 42 ug/dL (ref 28–170)
Saturation Ratios: 15 % (ref 10.4–31.8)
TIBC: 289 ug/dL (ref 250–450)
UIBC: 247 ug/dL

## 2019-04-08 LAB — TRIGLYCERIDES: Triglycerides: 81 mg/dL (ref <150)

## 2019-04-08 LAB — FERRITIN
Ferritin: 93 ng/mL (ref 11–307)
Ferritin: 95 ng/mL (ref 11–307)

## 2019-04-08 LAB — PHOSPHORUS: Phosphorus: 3.1 mg/dL (ref 2.5–4.6)

## 2019-04-08 LAB — VITAMIN B12: Vitamin B-12: 5070 pg/mL — ABNORMAL HIGH (ref 180–914)

## 2019-04-08 LAB — FOLATE: Folate: 33.7 ng/mL (ref >5.9)

## 2019-04-08 LAB — HIV ANTIBODY (ROUTINE TESTING W REFLEX): HIV Screen 4th Generation wRfx: NONREACTIVE

## 2019-04-08 LAB — C-REACTIVE PROTEIN: CRP: 11.2 mg/dL — ABNORMAL HIGH (ref <1.0)

## 2019-04-08 LAB — MAGNESIUM: Magnesium: 2.2 mg/dL (ref 1.7–2.4)

## 2019-04-08 LAB — CK: Total CK: 721 U/L — ABNORMAL HIGH (ref 38–234)

## 2019-04-08 MED ORDER — TOCILIZUMAB 400 MG/20ML IV SOLN
800.0000 mg | Freq: Once | INTRAVENOUS | Status: AC
  Administered 2019-04-08: 12:00:00 800 mg via INTRAVENOUS
  Filled 2019-04-08: qty 40

## 2019-04-08 NOTE — Progress Notes (Signed)
CRITICAL VALUE ALERT  Critical Value:  B12  Is 6606  Date & Time Notied:  04/08/2019 at 0931  Provider Notified: MD paged at 04/08/2019 at 0934  Orders Received/Actions taken: awaiting call back

## 2019-04-08 NOTE — Plan of Care (Signed)
  Problem: Education: Goal: Knowledge of risk factors and measures for prevention of condition will improve Outcome: Progressing   Problem: Coping: Goal: Psychosocial and spiritual needs will be supported Outcome: Not Progressing

## 2019-04-08 NOTE — Progress Notes (Signed)
[facility] PROGRESS NOTE    Progress Note  Charlene BeechamCynthia Pearson  ZOX:[id number] DOB: 1961-09-15 DOA: 04/07/2019 PCP: Georgann HousekeeperHusain, Karrar, MD     Brief Narrative:   Charlene HedgesCynthia Kandler-jones is an 58 y.o. female past medical history significant for hypertension, asthma, gastric bypass in 2009 presents to the ED with shortness of breath fever that started 12 days prior to admission progressively getting worse over the last 2 days.  She also reports having pain in the right calf that started 2 days prior to admission.  She tested positive at [facility]04/2019 she was discharged from the ED with a Z-Pak and inhalers.  The ED she was satting 86% was placed on 2 L oxygen and improved to 93, CRP is 14 d-dimer was 1.1 chest x-ray showed bilateral infiltrates she was started on IV Decadron and given Actemra in the ED.  Significant events: Symptoms started 7.6.2020 04/04/2019 [facility] 04/07/2019 IV Remdesivir for 5 days 04/07/2019 IV Decadron 04/07/2019 IV Actemra   Assessment/Plan:   Acute on chronic respiratory failure with hypoxia due to  COVID-19 virus infection She is maintaining her saturation with 2 L of oxygen satting greater than 92%. Inflammatory markers are significantly elevated, she relates her breathing is unchanged compared to yesterday. Procalcitonin was less than 0.1. I did talk to her about a second dose of Actemra and she has agreed. The treatment plan and use of medications (Actemra) and known side effects were discussed with patient/family, they were clearly explained that there is no proven definitive treatment for COVID-19 infection, any medications used here are based on published clinical articles/anecdotal data which are not peer-reviewed or randomized control trials.  Complete risks and long-term side effects are unknown, however in the best clinical judgment they seem to be of some clinical benefit rather than medical risks.   Patient/family agree with the treatment plan and want to receive the given medications.  Right calf tenderness: With significant elevated d-dimer lower extremity Doppler was negative for DVT.  Essential hypertension: Antihypertensive medications were held on admission as her blood pressure was on the lower side, her blood pressure today continues to be around the same we will continue to hold hydrochlorothiazide and ARB.  Microcytic anemia: Anemia panel has been sent  DVT prophylaxis: lovenxo Family Communication:none Disposition Plan/Barrier to D/C: once off oxygen Code Status:     Code Status Orders  (From admission, onward)         Start     Ordered   04/07/19 1301  Full code  Continuous     04/07/19 1300        Code Status History    This patient has a current code status but no historical code status.   Advance Care Planning Activity        IV Access:    Peripheral IV   Procedures and diagnostic studies:   Dg Chest Port 1 View  Result Date: 04/07/2019 CLINICAL DATA:  Shortness of breath.  COVID-19 positive EXAM: PORTABLE CHEST 1 VIEW COMPARISON:  08/16/2017 FINDINGS: Low volume chest with bilateral pulmonary infiltrate and probable superimposed atelectasis. No edema, effusion, or pneumothorax. Normal heart size accounting for rotation and low volumes. IMPRESSION: Bilateral pneumonia correlating with history of COVID-19. Low lung volumes. Electronically Signed   By: Marnee SpringJonathon  Watts M.D.   On: 04/07/2019 06:42   Vas [place] Lower Extremity Venous (dvt) (only Mc & Wl)  Result Date: 04/07/2019  Lower Venous Study Indications: Swelling.  Risk Factors:  COVID 19 positive. Comparison Study: No prior studies Performing Technologist: Oliver Hum RVT  Examination Guidelines: A complete evaluation includes B-mode imaging, spectral Doppler, color Doppler, and power Doppler as needed of all accessible portions of each vessel. Bilateral testing is considered an integral part of  a complete examination. Limited examinations for reoccurring indications may be performed as noted.  +---------+---------------+---------+-----------+----------+-------+  RIGHT     Compressibility Phasicity Spontaneity Properties Summary  +---------+---------------+---------+-----------+----------+-------+  CFV       Full            Yes       Yes                             +---------+---------------+---------+-----------+----------+-------+  SFJ       Full                                                      +---------+---------------+---------+-----------+----------+-------+  FV Prox   Full                                                      +---------+---------------+---------+-----------+----------+-------+  FV Mid    Full                                                      +---------+---------------+---------+-----------+----------+-------+  FV Distal Full                                                      +---------+---------------+---------+-----------+----------+-------+  PFV       Full                                                      +---------+---------------+---------+-----------+----------+-------+  POP       Full            Yes       Yes                             +---------+---------------+---------+-----------+----------+-------+  PTV       Full                                                      +---------+---------------+---------+-----------+----------+-------+  PERO      Full                                                      +---------+---------------+---------+-----------+----------+-------+   +----+---------------+---------+-----------+----------+-------+  LEFT Compressibility Phasicity Spontaneity Properties Summary  +----+---------------+---------+-----------+----------+-------+  CFV  Full            Yes       Yes                             +----+---------------+---------+-----------+----------+-------+     Summary: Right: There is no evidence of deep vein thrombosis in the lower  extremity. No cystic structure found in the popliteal fossa. Left: No evidence of common femoral vein obstruction.  *See table(s) above for measurements and observations. Electronically signed by Coral ElseVance Brabham MD on 04/07/2019 at 1:13:10 PM.    Final      Medical Consultants:    None.  Anti-Infectives:    Subjective:    Charlene BeechamCynthia Nicolaou-jones she relates her breathing is unchanged compared to yesterday.  Objective:    Vitals:   04/07/19 1600 04/07/19 1947 04/07/19 2037 04/08/19 0400  BP: 128/85  127/78 118/77  Pulse: 78     Resp: (!) 26     Temp:  98.9 F (37.2 C) 98.2 F (36.8 C) 98.1 F (36.7 C)  TempSrc:  Oral Oral Oral  SpO2: 93%     Weight:      Height:       SpO2: 93 % O2 Flow Rate (L/min): 2 L/min   Intake/Output Summary (Last 24 hours) at 04/08/2019 0726 Last data filed at 04/07/2019 1225 Gross per 24 hour  Intake 350 ml  Output --  Net 350 ml   Filed Weights   04/07/19 0852  Weight: 109 kg    Exam:General exam: In no acute distress. Respiratory system: Good air movement and diffuse crackles at bases. Cardiovascular system: S1 & S2 heard, RRR. No JVD. Gastrointestinal system: Abdomen is nondistended, soft and nontender.  Central nervous system: Alert and oriented. No focal neurological deficits. Extremities: No pedal edema. Skin: No rashes, lesions or ulcers Psychiatry: Judgement and insight appear normal. Mood & affect appropriate.    Data Reviewed:    Labs: Basic Metabolic Panel: Recent Labs  Lab 04/07/19 0613  NA 140  K 3.4*  CL 103  CO2 24  GLUCOSE 101*  BUN 10  CREATININE 0.61  CALCIUM 8.2*   GFR Estimated Creatinine Clearance: 99.3 mL/min (by C-G formula based on SCr of 0.61 mg/dL). Liver Function Tests: Recent Labs  Lab 04/07/19 0613  AST 62*  ALT 27  ALKPHOS 63  BILITOT 0.5  PROT 7.5  ALBUMIN 3.3*   No results for input(s): LIPASE, AMYLASE in the last 168 hours. No results for input(s): AMMONIA in the last 168  hours. Coagulation profile No results for input(s): INR, PROTIME in the last 168 hours. COVID-19 Labs  Recent Labs    04/07/19 0613  DDIMER 1.16*  FERRITIN 100  LDH 355*  CRP 14.3*    No results found for: SARSCOV2NAA  CBC: Recent Labs  Lab 04/07/19 0613 04/08/19 0420  WBC 7.5 5.5  NEUTROABS 5.9 PENDING  HGB 10.1* 9.9*  HCT 32.6* 31.8*  MCV 76.2* 75.4*  PLT 214 242   Cardiac Enzymes: No results for input(s): CKTOTAL, CKMB, CKMBINDEX, TROPONINI in the last 168 hours. BNP (last 3 results) No results for input(s): PROBNP in the last 8760 hours. CBG: No results for input(s): GLUCAP in the last 168 hours. D-Dimer: Recent Labs    04/07/19 0613  DDIMER 1.16*   Hgb A1c: No results for input(s): HGBA1C in the last 72 hours. Lipid Profile:  Recent Labs    04/07/19 0613  TRIG 144   Thyroid function studies: No results for input(s): TSH, T4TOTAL, T3FREE, THYROIDAB in the last 72 hours.  Invalid input(s): FREET3 Anemia work up: Recent Labs    04/07/19 0613  FERRITIN 100   Sepsis Labs: Recent Labs  Lab 04/07/19 0613 04/08/19 0420  PROCALCITON <0.10  --   WBC 7.5 5.5  LATICACIDVEN 0.9  --    Microbiology Recent Results (from the past 240 hour(s))  Blood Culture (routine x 2)     Status: None (Preliminary result)   Collection Time: 04/07/19  6:13 AM   Specimen: BLOOD  Result Value Ref Range Status   Specimen Description   Final    BLOOD LEFT ANTECUBITAL Performed at [facility], [address] [place], [place] [address]    Special Requests   Final    BOTTLES DRAWN AEROBIC AND ANAEROBIC Blood Culture adequate volume Performed at [facility], [address] [place], [place] [address]    Culture PENDING  Incomplete   Report Status PENDING  Incomplete     Medications:    dexamethasone  6 mg Oral Daily   enoxaparin (LOVENOX) injection  50 mg Subcutaneous Q24H   Ipratropium-Albuterol  1 puff Inhalation Q6H   vitamin  C  500 mg Oral Daily   zinc sulfate  220 mg Oral Daily   Continuous Infusions:  remdesivir 100 mg in NS 250 mL        LOS: 1 day   Marinda ElkAbraham Feliz Ortiz  [facility]  04/08/2019, 7:26 AM

## 2019-04-08 NOTE — Progress Notes (Signed)
Sitting in recliner, ambulating in room, and slept on her side throughout the night.   She voluntarily uses the incentive and does her breathing exercises.   Staff will continue to meet patient's needs.

## 2019-04-08 NOTE — Progress Notes (Signed)
Pt unable to tolerate prone position any longer. States it maker her anxious. Pt assisted to high fowlers pt and worked in Chiropodist which she states "calms me down". Did educate on importance of proning and Md recommendations for duration. Pt states she will try again later. States she has anxiety but does not want anything to help for it at this time.

## 2019-04-08 NOTE — Progress Notes (Signed)
Pt proned for 1 hour. Was not longer able to tolerate.Was assisted up to [facility] then to recliner chair. Pt eating dinner with call bell in reach.

## 2019-04-09 LAB — COMPREHENSIVE METABOLIC PANEL
ALT: 41 U/L (ref 0–44)
AST: 62 U/L — ABNORMAL HIGH (ref 15–41)
Albumin: 2.9 g/dL — ABNORMAL LOW (ref 3.5–5.0)
Alkaline Phosphatase: 60 U/L (ref 38–126)
Anion gap: 11 (ref 5–15)
BUN: 22 mg/dL — ABNORMAL HIGH (ref 6–20)
CO2: 26 mmol/L (ref 22–32)
Calcium: 8.5 mg/dL — ABNORMAL LOW (ref 8.9–10.3)
Chloride: 104 mmol/L (ref 98–111)
Creatinine, Ser: 0.72 mg/dL (ref 0.44–1.00)
GFR calc Af Amer: >60 mL/min (ref >60)
GFR calc non Af Amer: >60 mL/min (ref >60)
Glucose, Bld: 107 mg/dL — ABNORMAL HIGH (ref 70–99)
Potassium: 3.6 mmol/L (ref 3.5–5.1)
Sodium: 141 mmol/L (ref 135–145)
Total Bilirubin: 0.1 mg/dL — ABNORMAL LOW (ref 0.3–1.2)
Total Protein: 7 g/dL (ref 6.5–8.1)

## 2019-04-09 LAB — C-REACTIVE PROTEIN: CRP: 5.2 mg/dL — ABNORMAL HIGH (ref <1.0)

## 2019-04-09 LAB — CBC WITH DIFFERENTIAL/PLATELET
Abs Immature Granulocytes: 0.19 K/uL — ABNORMAL HIGH (ref 0.00–0.07)
Basophils Absolute: 0 K/uL (ref 0.0–0.1)
Basophils Relative: 0 %
Eosinophils Absolute: 0 K/uL (ref 0.0–0.5)
Eosinophils Relative: 0 %
HCT: 32.6 % — ABNORMAL LOW (ref 36.0–46.0)
Hemoglobin: 9.8 g/dL — ABNORMAL LOW (ref 12.0–15.0)
Immature Granulocytes: 2 %
Lymphocytes Relative: 18 %
Lymphs Abs: 1.6 K/uL (ref 0.7–4.0)
MCH: 22.7 pg — ABNORMAL LOW (ref 26.0–34.0)
MCHC: 30.1 g/dL (ref 30.0–36.0)
MCV: 75.5 fL — ABNORMAL LOW (ref 80.0–100.0)
Monocytes Absolute: 0.8 K/uL (ref 0.1–1.0)
Monocytes Relative: 9 %
Neutro Abs: 6.3 K/uL (ref 1.7–7.7)
Neutrophils Relative %: 71 %
Platelets: 282 K/uL (ref 150–400)
RBC: 4.32 MIL/uL (ref 3.87–5.11)
RDW: 16.2 % — ABNORMAL HIGH (ref 11.5–15.5)
WBC: 8.9 K/uL (ref 4.0–10.5)
nRBC: 0.2 % (ref 0.0–0.2)

## 2019-04-09 LAB — TRIGLYCERIDES: Triglycerides: 95 mg/dL (ref <150)

## 2019-04-09 LAB — PHOSPHORUS: Phosphorus: 3.5 mg/dL (ref 2.5–4.6)

## 2019-04-09 LAB — FERRITIN: Ferritin: 84 ng/mL (ref 11–307)

## 2019-04-09 LAB — INTERLEUKIN-6, PLASMA: Interleukin-6, Plasma: 21.8 pg/mL — ABNORMAL HIGH (ref 0.0–12.2)

## 2019-04-09 LAB — CK: Total CK: 347 U/L — ABNORMAL HIGH (ref 38–234)

## 2019-04-09 LAB — MAGNESIUM: Magnesium: 2.2 mg/dL (ref 1.7–2.4)

## 2019-04-09 LAB — D-DIMER, QUANTITATIVE: D-Dimer, Quant: 1.43 ug/mL-FEU — ABNORMAL HIGH (ref 0.00–0.50)

## 2019-04-09 NOTE — Evaluation (Signed)
Physical Therapy Evaluation Patient Details Name: Charlene Pearson MRN: [id number] DOB: 1961-07-13 Today's Date: 04/09/2019   History of Present Illness  Charlene Pearson is an 58 y.o. female past medical history significant for hypertension, asthma, gastric bypass in 2009 presents to the ED 04/07/19  with shortness of breath fever that started 12 days prior to admission progressively getting worse over the last 2 days.  She also reports having pain in the right calf that started 2 days prior to admission.  She tested positive at [facility] on 04/04/2019  Clinical Impression  The  Patient presents with generalized weakness, frequent coughing. Patient Ambulated x 30' x 2 on 3 L [place] at 95% sats. Patient will need to be independent to DC home. Pt admitted with above diagnosis. Pt currently with functional limitations due to the deficits listed below (see PT Problem List).  Pt will benefit from skilled PT to increase their independence and safety with mobility to allow discharge to the venue listed below.      Follow Up Recommendations No PT follow up    Equipment Recommendations  None recommended by PT    Recommendations for Other Services       Precautions / Restrictions Precautions Precautions: Fall      Mobility  Bed Mobility               General bed mobility comments: in recliner  Transfers Overall transfer level: Needs assistance Equipment used: 1 person hand held assist Transfers: Sit to/from Stand Sit to Stand: Min guard         General transfer comment: patient stood from recliner and BSC in BR using rails, close supervision  Ambulation/Gait Ambulation/Gait assistance: Min assist;Min guard Gait Distance (Feet): 30 Feet(x 2) Assistive device: 1 person hand held assist;IV Pole Gait Pattern/deviations: Step-to pattern;Decreased stride length Gait velocity: decr   General Gait Details: slow gait speed, intermitent held onto wall, rails, cabinent and 1  HHA  Stairs            Wheelchair Mobility    Modified Rankin (Stroke Patients Only)       Balance Overall balance assessment: Needs assistance Sitting-balance support: No upper extremity supported Sitting balance-Leahy Scale: Good     Standing balance support: During functional activity;Single extremity supported Standing balance-Leahy Scale: Fair                               Pertinent Vitals/Pain Pain Assessment: No/denies pain    Home Living Family/patient expects to be discharged to:: Private residence Living Arrangements: Other relatives;Alone Available Help at Discharge: Family;Available PRN/intermittently Type of Home: Apartment Home Access: Stairs to enter Entrance Stairs-Rails: Psychiatric nurse of Steps: 14+ 7 Home Layout: One level Home Equipment: None      Prior Function Level of Independence: Independent               Hand Dominance        Extremity/Trunk Assessment   Upper Extremity Assessment Upper Extremity Assessment: Defer to OT evaluation    Lower Extremity Assessment Lower Extremity Assessment: Generalized weakness    Cervical / Trunk Assessment Cervical / Trunk Assessment: Normal  Communication   Communication: No difficulties  Cognition Arousal/Alertness: Awake/alert Behavior During Therapy: Flat affect;WFL for tasks assessed/performed Overall Cognitive Status: Within Functional Limits for tasks assessed  General Comments      Exercises     Assessment/Plan    PT Assessment Patient needs continued PT services  PT Problem List Decreased strength;Decreased mobility;Decreased activity tolerance;Decreased knowledge of use of DME;Cardiopulmonary status limiting activity;Decreased knowledge of precautions;Decreased safety awareness       PT Treatment Interventions DME instruction;Gait training;Stair training;Functional mobility  training;Therapeutic exercise;Patient/family education;Therapeutic activities    PT Goals (Current goals can be found in the Care Plan section)  Acute Rehab PT Goals Patient Stated Goal: walk more, go home PT Goal Formulation: With patient Time For Goal Achievement: 04/23/19 Potential to Achieve Goals: Good    Frequency Min 3X/week   Barriers to discharge Decreased caregiver support syates daughwer will bring things, noone will stay with her    Co-evaluation               AM-PAC PT "6 Clicks" Mobility  Outcome Measure Help needed turning from your back to your side while in a flat bed without using bedrails?: A Little Help needed moving from lying on your back to sitting on the side of a flat bed without using bedrails?: A Little Help needed moving to and from a bed to a chair (including a wheelchair)?: A Little Help needed standing up from a chair using your arms (e.g., wheelchair or bedside chair)?: A Little Help needed to walk in hospital room?: A Little Help needed climbing 3-5 steps with a railing? : A Lot 6 Click Score: 17    End of Session Equipment Utilized During Treatment: Oxygen Activity Tolerance: Patient tolerated treatment well Patient left: in chair;with call bell/phone within reach;with nursing/sitter in room Nurse Communication: Mobility status PT Visit Diagnosis: Unsteadiness on feet (R26.81)    Time: [id number] PT Time Calculation (min) (ACUTE ONLY): 52 min   Charges:   PT Evaluation $PT Eval Moderate Complexity: 1 Mod PT Treatments $Gait Training: 8-22 mins $Self Care/Home Management: 8-22        Blanchard KelchKaren Caili Escalera PT Acute Rehabilitation Services Pager [phone] Office [phone]   Rada HayHill, Aubrielle Stroud Elizabeth 04/09/2019, 1:38 PM

## 2019-04-09 NOTE — Progress Notes (Signed)
[facility] PROGRESS NOTE    Progress Note  Charlene BeechamCynthia Pearson  ZOX:[id number] DOB: [birth year] DOA: 04/07/2019 PCP: Georgann HousekeeperHusain, Karrar, MD     Brief Narrative:   Charlene Pearson is an 58 y.o. female past medical history significant for hypertension, asthma, gastric bypass in 2009 presents to the ED with shortness of breath fever that started 12 days prior to admission progressively getting worse over the last 2 days.  She also reports having pain in the right calf that started 2 days prior to admission.  She [facility] on 04/04/2019 she was discharged from the ED with a Z-Pak and inhalers.  The ED she was satting 86% was placed on 2 L oxygen and improved to 93, CRP is 14 d-dimer was 1.1 chest x-ray showed bilateral infiltrates she was started on IV Decadron and given Actemra in the ED.  Significant events: Symptoms started 7.6.2020 04/04/2019 SARS[facility]CoV-2 [facility] 04/07/2019 IV Remdesivir for 5 days 04/07/2019 IV Decadron 04/07/2019 IV Actemra   Assessment/Plan:   Acute on chronic respiratory failure with hypoxia due to  COVID-19 virus infection Her saturations have been greater than 92% on 2 L nasal cannula. Inflammatory markers are significantly improved, she relates she is not as anxious as she was yesterday. Her procalcitonin was less than 0.1. Continue to supplemental oxygen. Continue IV Remdesivir with 5 days, continue steroids, she was given a single dose of Actemra on 04/07/2019  Right calf tenderness: With significant elevated d-dimer lower extremity Doppler was negative for DVT.  Essential hypertension: Antihypertensive medications were held on admission, her blood pressure continues to be stable.  Microcytic anemia: Anemia panel has been sent  DVT prophylaxis: lovenxo Family Communication:none Disposition Plan/Barrier to D/C: once off oxygen Code Status:     Code Status Orders  (From admission, onward)          Start     Ordered   04/07/19 1301  Full code  Continuous     04/07/19 1300        Code Status History    This patient has a current code status but no historical code status.   Advance Care Planning Activity        IV Access:    Peripheral IV   Procedures and diagnostic studies:   Vas [place] Lower Extremity Venous (dvt) (only Mc & [facility])  Result Date: 04/07/2019  Lower Venous Study Indications: Swelling.  Risk Factors: COVID 19 positive. Comparison Study: No prior studies Performing Technologist: Chanda BusingGregory Collins RVT  Examination Guidelines: A complete evaluation includes B-mode imaging, spectral Doppler, color Doppler, and power Doppler as needed of all accessible portions of each vessel. Bilateral testing is considered an integral part of a complete examination. Limited examinations for reoccurring indications may be performed as noted.  +---------+---------------+---------+-----------+----------+-------+ RIGHT    CompressibilityPhasicitySpontaneityPropertiesSummary +---------+---------------+---------+-----------+----------+-------+ CFV      Full           Yes      Yes                          +---------+---------------+---------+-----------+----------+-------+ SFJ      Full                                                 +---------+---------------+---------+-----------+----------+-------+ FV Prox  Full                                                 +---------+---------------+---------+-----------+----------+-------+  FV Mid   Full                                                 +---------+---------------+---------+-----------+----------+-------+ FV DistalFull                                                 +---------+---------------+---------+-----------+----------+-------+ PFV      Full                                                 +---------+---------------+---------+-----------+----------+-------+ POP      Full           Yes      Yes                           +---------+---------------+---------+-----------+----------+-------+ PTV      Full                                                 +---------+---------------+---------+-----------+----------+-------+ PERO     Full                                                 +---------+---------------+---------+-----------+----------+-------+   +----+---------------+---------+-----------+----------+-------+ LEFTCompressibilityPhasicitySpontaneityPropertiesSummary +----+---------------+---------+-----------+----------+-------+ CFV Full           Yes      Yes                          +----+---------------+---------+-----------+----------+-------+     Summary: Right: There is no evidence of deep vein thrombosis in the lower extremity. No cystic structure found in the popliteal fossa. Left: No evidence of common femoral vein obstruction.  *See table(s) above for measurements and observations. Electronically signed by Harold Barban MD on 04/07/2019 at 1:13:10 PM.    Final      Medical Consultants:    None.  Anti-Infectives:    Subjective:    Charlene Pearson She does not feel as anxious as yesterday.  Objective:    Vitals:   04/08/19 1656 04/08/19 2011 04/09/19 0040 04/09/19 0405  BP: 125/90 118/79 125/80 133/86  Pulse: 71 70 68 61  Resp: 17 (!) 27 (!) 21   Temp:  98.2 F (36.8 C) 98.6 F (37 C) 98.1 F (36.7 C)  TempSrc:  Oral Oral Oral  SpO2: 94% 94% 95% 95%  Weight:      Height:       SpO2: 95 % O2 Flow Rate (L/min): 2 L/min   Intake/Output Summary (Last 24 hours) at 04/09/2019 0716 Last data filed at 04/08/2019 1745 Gross per 24 hour  Intake 1530 ml  Output -  Net 1530 ml   Filed Weights   04/07/19 0852  Weight: 109 kg    Exam: General exam: In no acute distress. Respiratory  system: Good air movement and diffuse crackles. Cardiovascular system: S1 & S2 heard, RRR. No JVD. Gastrointestinal system: Abdomen is  nondistended, soft and nontender.  Central nervous system: Alert and oriented. No focal neurological deficits. Extremities: No pedal edema. Skin: No rashes, lesions or ulcers Psychiatry: Judgement and insight appear normal. Mood & affect appropriate.    Data Reviewed:    Labs: Basic Metabolic Panel: Recent Labs  Lab 04/07/19 0613 04/08/19 0420 04/09/19 0448  NA 140 141 141  K 3.4* 3.7 3.6  CL 103 105 104  CO2 24 23 26   GLUCOSE 101* 132* 107*  BUN 10 14 22*  CREATININE 0.61 0.63 0.72  CALCIUM 8.2* 8.5* 8.5*  MG  --  2.2 2.2  PHOS  --  3.1 3.5   GFR Estimated Creatinine Clearance: 99.3 mL/min (by C-G formula based on SCr of 0.72 mg/dL). Liver Function Tests: Recent Labs  Lab 04/07/19 0613 04/08/19 0420 04/09/19 0448  AST 62* 68* 62*  ALT 27 34 41  ALKPHOS 63 65 60  BILITOT 0.5 0.2* 0.1*  PROT 7.5 7.3 7.0  ALBUMIN 3.3* 3.0* 2.9*   No results for input(s): LIPASE, AMYLASE in the last 168 hours. No results for input(s): AMMONIA in the last 168 hours. Coagulation profile No results for input(s): INR, PROTIME in the last 168 hours. COVID-19 Labs  Recent Labs    04/07/19 0613 04/08/19 0420 04/09/19 0448  DDIMER 1.16* 1.76* 1.43*  FERRITIN 100 95  93 84  LDH 355*  --   --   CRP 14.3* 11.2* 5.2*    No results found for: SARSCOV2NAA  CBC: Recent Labs  Lab 04/07/19 0613 04/08/19 0420 04/09/19 0448  WBC 7.5 5.5 8.9  NEUTROABS 5.9 3.7 6.3  HGB 10.1* 9.9* 9.8*  HCT 32.6* 31.8* 32.6*  MCV 76.2* 75.4* 75.5*  PLT 214 242 282   Cardiac Enzymes: Recent Labs  Lab 04/08/19 0420 04/09/19 0448  CKTOTAL 721* 347*   BNP (last 3 results) No results for input(s): PROBNP in the last 8760 hours. CBG: No results for input(s): GLUCAP in the last 168 hours. D-Dimer: Recent Labs    04/08/19 0420 04/09/19 0448  DDIMER 1.76* 1.43*   Hgb A1c: No results for input(s): HGBA1C in the last 72 hours. Lipid Profile: Recent Labs    04/08/19 0420 04/09/19 0448   TRIG 81 95   Thyroid function studies: No results for input(s): TSH, T4TOTAL, T3FREE, THYROIDAB in the last 72 hours.  Invalid input(s): FREET3 Anemia work up: Recent Labs    04/08/19 0420 04/09/19 0448  VITAMINB12 5,070*  --   FOLATE 33.7  --   FERRITIN 95  93 84  TIBC 289  --   IRON 42  --    Sepsis Labs: Recent Labs  Lab 04/07/19 0613 04/08/19 0420 04/09/19 0448  PROCALCITON <0.10  --   --   WBC 7.5 5.5 8.9  LATICACIDVEN 0.9  --   --    Microbiology Recent Results (from the past 240 hour(s))  Blood Culture (routine x 2)     Status: None (Preliminary result)   Collection Time: 04/07/19  6:13 AM   Specimen: BLOOD  Result Value Ref Range Status   Specimen Description   Final    BLOOD RIGHT ANTECUBITAL Performed at [facility], [address] [place], [place] [address]    Special Requests   Final    BOTTLES DRAWN AEROBIC AND ANAEROBIC Blood Culture adequate volume Performed at [facility],  [address] [place], [place] [address]    Culture   Final    NO GROWTH 1 DAY Performed at [facility], [address] [place], [place] [address]    Report Status PENDING  Incomplete  Blood Culture (routine x 2)     Status: None (Preliminary result)   Collection Time: 04/07/19  6:13 AM   Specimen: BLOOD  Result Value Ref Range Status   Specimen Description   Final    BLOOD LEFT ANTECUBITAL Performed at [facility], [address] [place], [place] [address]    Special Requests   Final    BOTTLES DRAWN AEROBIC AND ANAEROBIC Blood Culture adequate volume Performed at [facility], [address] [place], [place] [address]    Culture   Final    NO GROWTH 1 DAY Performed at [facility], [address] [place], [place] [address]    Report Status PENDING  Incomplete     Medications:   . dexamethasone  6 mg Oral Daily  . enoxaparin (LOVENOX) injection  50 mg Subcutaneous Q24H  .  Ipratropium-Albuterol  1 puff Inhalation Q6H  . vitamin C  500 mg Oral Daily  . zinc sulfate  220 mg Oral Daily   Continuous Infusions: . remdesivir 100 mg in NS 250 mL 100 mg (04/08/19 1243)      LOS: 2 days   Marinda ElkAbraham Feliz Ortiz  [facility]  04/09/2019, 7:16 AM

## 2019-04-10 LAB — CBC WITH DIFFERENTIAL/PLATELET
Abs Immature Granulocytes: 0.36 10*3/uL — ABNORMAL HIGH (ref 0.00–0.07)
Basophils Absolute: 0 10*3/uL (ref 0.0–0.1)
Basophils Relative: 0 %
Eosinophils Absolute: 0 10*3/uL (ref 0.0–0.5)
Eosinophils Relative: 0 %
HCT: 32 % — ABNORMAL LOW (ref 36.0–46.0)
Hemoglobin: 10 g/dL — ABNORMAL LOW (ref 12.0–15.0)
Immature Granulocytes: 4 %
Lymphocytes Relative: 24 %
Lymphs Abs: 2.3 10*3/uL (ref 0.7–4.0)
MCH: 23.4 pg — ABNORMAL LOW (ref 26.0–34.0)
MCHC: 31.3 g/dL (ref 30.0–36.0)
MCV: 74.9 fL — ABNORMAL LOW (ref 80.0–100.0)
Monocytes Absolute: 0.7 10*3/uL (ref 0.1–1.0)
Monocytes Relative: 7 %
Neutro Abs: 6.3 10*3/uL (ref 1.7–7.7)
Neutrophils Relative %: 65 %
Platelets: 370 10*3/uL (ref 150–400)
RBC: 4.27 MIL/uL (ref 3.87–5.11)
RDW: 16.4 % — ABNORMAL HIGH (ref 11.5–15.5)
WBC: 9.7 10*3/uL (ref 4.0–10.5)
nRBC: 0.3 % — ABNORMAL HIGH (ref 0.0–0.2)

## 2019-04-10 LAB — PHOSPHORUS: Phosphorus: 4.1 mg/dL (ref 2.5–4.6)

## 2019-04-10 LAB — CK: Total CK: 199 U/L (ref 38–234)

## 2019-04-10 LAB — COMPREHENSIVE METABOLIC PANEL
ALT: 39 U/L (ref 0–44)
AST: 44 U/L — ABNORMAL HIGH (ref 15–41)
Albumin: 2.9 g/dL — ABNORMAL LOW (ref 3.5–5.0)
Alkaline Phosphatase: 64 U/L (ref 38–126)
Anion gap: 9 (ref 5–15)
BUN: 20 mg/dL (ref 6–20)
CO2: 27 mmol/L (ref 22–32)
Calcium: 8.4 mg/dL — ABNORMAL LOW (ref 8.9–10.3)
Chloride: 107 mmol/L (ref 98–111)
Creatinine, Ser: 0.7 mg/dL (ref 0.44–1.00)
GFR calc Af Amer: >60 mL/min (ref >60)
GFR calc non Af Amer: >60 mL/min (ref >60)
Glucose, Bld: 85 mg/dL (ref 70–99)
Potassium: 3.4 mmol/L — ABNORMAL LOW (ref 3.5–5.1)
Sodium: 143 mmol/L (ref 135–145)
Total Bilirubin: <0.1 mg/dL — ABNORMAL LOW (ref 0.3–1.2)
Total Protein: 6.7 g/dL (ref 6.5–8.1)

## 2019-04-10 LAB — MAGNESIUM: Magnesium: 2 mg/dL (ref 1.7–2.4)

## 2019-04-10 LAB — INTERLEUKIN-6, PLASMA: Interleukin-6, Plasma: 44.1 pg/mL — ABNORMAL HIGH (ref 0.0–12.2)

## 2019-04-10 LAB — TRIGLYCERIDES: Triglycerides: 102 mg/dL (ref <150)

## 2019-04-10 LAB — FERRITIN: Ferritin: 72 ng/mL (ref 11–307)

## 2019-04-10 LAB — C-REACTIVE PROTEIN: CRP: 2.6 mg/dL — ABNORMAL HIGH (ref <1.0)

## 2019-04-10 LAB — D-DIMER, QUANTITATIVE: D-Dimer, Quant: 1.35 ug/mL-FEU — ABNORMAL HIGH (ref 0.00–0.50)

## 2019-04-10 MED ORDER — GUAIFENESIN 100 MG/5ML PO SOLN
10.0000 mL | ORAL | Status: DC | PRN
  Administered 2019-04-10 – 2019-04-12 (×8): 200 mg via ORAL
  Filled 2019-04-10 (×8): qty 15

## 2019-04-10 MED ORDER — POTASSIUM CHLORIDE CRYS ER 20 MEQ PO TBCR
40.0000 meq | EXTENDED_RELEASE_TABLET | Freq: Two times a day (BID) | ORAL | Status: AC
  Administered 2019-04-10 (×2): 40 meq via ORAL
  Filled 2019-04-10 (×2): qty 2

## 2019-04-10 MED ORDER — DM-GUAIFENESIN ER 30-600 MG PO TB12
1.0000 | ORAL_TABLET | Freq: Two times a day (BID) | ORAL | Status: DC
  Administered 2019-04-10: 1 via ORAL
  Filled 2019-04-10: qty 1

## 2019-04-10 NOTE — Progress Notes (Signed)
Patient spoke with family on personal phone. This RN offered facetime assistance. Patient refuses at this time.

## 2019-04-10 NOTE — Progress Notes (Signed)
[facility] PROGRESS NOTE    Progress Note  Charlene Pearson  CVE:[id number] DOB: 04-26-1961 DOA: 04/07/2019 PCP: [name], MD     Brief Narrative:   Charlene Pearson is an 58 y.o. female past medical history significant for hypertension, asthma, gastric bypass in 2009 presents to the ED with shortness of breath fever that started 12 days prior to admission progressively getting worse over the last 2 days.  She also reports having pain in the right calf that started 2 days prior to admission.  She tested positive at [facility] on 04/04/2019 she was discharged from the ED with a Z-Pak and inhalers.  The ED she was satting 86% was placed on 2 L oxygen and improved to 93, CRP is 14 d-dimer was 1.1 chest x-ray showed bilateral infiltrates she was started on IV Decadron and given Actemra in the ED.  Significant events: Symptoms started 7.6.2020 04/04/2019 SARS-CoV-2 positive at [facility] 04/07/2019 IV Remdesivir for 5 days 04/07/2019 IV Decadron 04/07/2019 IV Actemra   Assessment/Plan:   Acute on chronic respiratory failure with hypoxia due to  COVID-19 virus infection She is now satting 95% on room air. Inflammatory markers are significantly improved, she relates she is not as anxious as she was yesterday. Her procalcitonin was less than 0.1. Left to continue IV of the severe for 5 days, continue oral Decadron.  Right calf tenderness: With significant elevated d-dimer lower extremity Doppler was negative for DVT.  Essential hypertension: Antihypertensive medications were held on admission, her blood pressure continues to be stable.  Microcytic anemia: Anemia panel has been sent  DVT prophylaxis: lovenxo Family Communication:none Disposition Plan/Barrier to D/C: once off oxygen Code Status:     Code Status Orders  (From admission, onward)         Start     Ordered   04/07/19 1301  Full code  Continuous     04/07/19 1300        Code  Status History    This patient has a current code status but no historical code status.   Advance Care Planning Activity        IV Access:    Peripheral IV   Procedures and diagnostic studies:   No results found.   Medical Consultants:    None.  Anti-Infectives:    Subjective:    [name] she relates she feels drastically better compared to yesterday, she is excited that she is on room air.  Objective:    Vitals:   04/09/19 2314 04/10/19 0200 04/10/19 0418 04/10/19 0600  BP:  123/72 132/76   Pulse:  62  (!) 56  Resp:  (!) 23  (!) 25  Temp: 98.4 F (36.9 C)   98.2 F (36.8 C)  TempSrc: Oral   Axillary  SpO2:  97%  95%  Weight:      Height:       SpO2: 95 % O2 Flow Rate (L/min): 2 L/min   Intake/Output Summary (Last 24 hours) at 04/10/2019 0805 Last data filed at 04/10/2019 0700 Gross per 24 hour  Intake 960 ml  Output 800 ml  Net 160 ml   Filed Weights   04/07/19 0852  Weight: 109 kg    Exam: General exam: In no acute distress. Respiratory system: Good air movement and crackles at bases bilaterally more prominent on the right. Cardiovascular system: S1 & S2 heard, RRR. No JVD. Gastrointestinal system: Abdomen is nondistended, soft and nontender.  Central nervous system: Alert and oriented.  No focal neurological deficits. Extremities: No pedal edema. Skin: No rashes, lesions or ulcers Psychiatry: Judgement and insight appear normal. Mood & affect appropriate.   Data Reviewed:    Labs: Basic Metabolic Panel: Recent Labs  Lab 04/07/19 0613 04/08/19 0420 04/09/19 0448 04/10/19 0213  NA 140 141 141 143  K 3.4* 3.7 3.6 3.4*  CL 103 105 104 107  CO2 24 23 26 27   GLUCOSE 101* 132* 107* 85  BUN 10 14 22* 20  CREATININE 0.61 0.63 0.72 0.70  CALCIUM 8.2* 8.5* 8.5* 8.4*  MG  --  2.2 2.2 2.0  PHOS  --  3.1 3.5 4.1   GFR Estimated Creatinine Clearance: 99.3 mL/min (by C-G formula based on SCr of 0.7 mg/dL). Liver Function  Tests: Recent Labs  Lab 04/07/19 0613 04/08/19 0420 04/09/19 0448 04/10/19 0213  AST 62* 68* 62* 44*  ALT 27 34 41 39  ALKPHOS 63 65 60 64  BILITOT 0.5 0.2* 0.1* <0.1*  PROT 7.5 7.3 7.0 6.7  ALBUMIN 3.3* 3.0* 2.9* 2.9*   No results for input(s): LIPASE, AMYLASE in the last 168 hours. No results for input(s): AMMONIA in the last 168 hours. Coagulation profile No results for input(s): INR, PROTIME in the last 168 hours. COVID-19 Labs  Recent Labs    04/08/19 0420 04/09/19 0448 04/10/19 0213  DDIMER 1.76* 1.43* 1.35*  FERRITIN 95  93 84 72  CRP 11.2* 5.2* 2.6*    No results found for: SARSCOV2NAA  CBC: Recent Labs  Lab 04/07/19 0613 04/08/19 0420 04/09/19 0448 04/10/19 0213  WBC 7.5 5.5 8.9 9.7  NEUTROABS 5.9 3.7 6.3 6.3  HGB 10.1* 9.9* 9.8* 10.0*  HCT 32.6* 31.8* 32.6* 32.0*  MCV 76.2* 75.4* 75.5* 74.9*  PLT 214 242 282 370   Cardiac Enzymes: Recent Labs  Lab 04/08/19 0420 04/09/19 0448 04/10/19 0213  CKTOTAL 721* 347* 199   BNP (last 3 results) No results for input(s): PROBNP in the last 8760 hours. CBG: No results for input(s): GLUCAP in the last 168 hours. D-Dimer: Recent Labs    04/09/19 0448 04/10/19 0213  DDIMER 1.43* 1.35*   Hgb A1c: No results for input(s): HGBA1C in the last 72 hours. Lipid Profile: Recent Labs    04/09/19 0448 04/10/19 0213  TRIG 95 102   Thyroid function studies: No results for input(s): TSH, T4TOTAL, T3FREE, THYROIDAB in the last 72 hours.  Invalid input(s): FREET3 Anemia work up: Recent Labs    04/08/19 0420 04/09/19 0448 04/10/19 0213  VITAMINB12 5,070*  --   --   FOLATE 33.7  --   --   FERRITIN 95  93 84 72  TIBC 289  --   --   IRON 42  --   --    Sepsis Labs: Recent Labs  Lab 04/07/19 0613 04/08/19 0420 04/09/19 0448 04/10/19 0213  PROCALCITON <0.10  --   --   --   WBC 7.5 5.5 8.9 9.7  LATICACIDVEN 0.9  --   --   --    Microbiology Recent Results (from the past 240 hour(s))  Blood  Culture (routine x 2)     Status: None (Preliminary result)   Collection Time: 04/07/19  6:13 AM   Specimen: BLOOD  Result Value Ref Range Status   Specimen Description   Final    BLOOD RIGHT ANTECUBITAL Performed at [facility], [address] [place], [place] [address]    Special Requests   Final    BOTTLES  DRAWN AEROBIC AND ANAEROBIC Blood Culture adequate volume Performed at [facility], [address] [place], [place] [address]    Culture   Final    NO GROWTH 2 DAYS Performed at [facility], [address] [place], [place] [address]    Report Status PENDING  Incomplete  Blood Culture (routine x 2)     Status: None (Preliminary result)   Collection Time: 04/07/19  6:13 AM   Specimen: BLOOD  Result Value Ref Range Status   Specimen Description   Final    BLOOD LEFT ANTECUBITAL Performed at [facility], [address] [place], [place] [address]    Special Requests   Final    BOTTLES DRAWN AEROBIC AND ANAEROBIC Blood Culture adequate volume Performed at [facility], [address] [place], [place] [address]    Culture   Final    NO GROWTH 2 DAYS Performed at [facility], [address] [place], [place] [address]    Report Status PENDING  Incomplete     Medications:   . dexamethasone  6 mg Oral Daily  . dextromethorphan-guaiFENesin  1 tablet Oral BID  . enoxaparin (LOVENOX) injection  50 mg Subcutaneous Q24H  . Ipratropium-Albuterol  1 puff Inhalation Q6H  . vitamin C  500 mg Oral Daily  . zinc sulfate  220 mg Oral Daily   Continuous Infusions: . remdesivir 100 mg in NS 250 mL 100 mg (04/09/19 1016)      LOS: 3 days   Marinda ElkAbraham Feliz Ortiz  [facility]  04/10/2019, 8:05 AM

## 2019-04-10 NOTE — Evaluation (Signed)
Occupational Therapy Evaluation Patient Details Name: Charlene Pearson MRN: [id number] DOB: 1961/05/30 Today's Date: 04/10/2019    History of Present Illness Charlene Pearson is an 58 y.o. female past medical history significant for hypertension, asthma, gastric bypass in 2009 presents to the ED 04/07/19  with shortness of breath fever that started 12 days prior to admission progressively getting worse over the last 2 days.  She also reports having pain in the right calf that started 2 days prior to admission.  She [facility]   Clinical Impression   PTA Pt independent in ADL and mobility. Pt is a Education officer, environmentalpastor, sells [employer] and likes walking her small dog. Today Pt is overall min guard. DOE 2/4 with activity however, SpO2 remains above 94% on RA throughout (finger probe). Pt able to ambulate to bathroom for toilet transfer, peri care, manage mesh underwear. Perform sink level grooming and then sponge bath. Pt did require sitting for safety and energy conservation. Recommend that OT follow acutely to maximize safety and independence in ADL and functional transfers, with next session to focus on establishing HEP and provide energy conservation handout.    Follow Up Recommendations  No OT follow up;Supervision - Intermittent    Equipment Recommendations  3 in 1 bedside commode    Recommendations for Other Services       Precautions / Restrictions Precautions Precautions: Fall Restrictions Weight Bearing Restrictions: No      Mobility Bed Mobility               General bed mobility comments: OOB in recliner at beginning and end of session  Transfers Overall transfer level: Needs assistance Equipment used: None Transfers: Sit to/from Stand Sit to Stand: Min guard         General transfer comment: patient stood from recliner and BSC in BR using rails    Balance Overall balance assessment: Needs assistance Sitting-balance support:  No upper extremity supported Sitting balance-Leahy Scale: Good     Standing balance support: During functional activity;Single extremity supported Standing balance-Leahy Scale: Fair                             ADL either performed or assessed with clinical judgement   ADL Overall ADL's : Needs assistance/impaired Eating/Feeding: Independent   Grooming: Wash/dry hands;Wash/dry face;Min guard;Oral care;Standing Grooming Details (indicate cue type and reason): seated rest breaks between activity Upper Body Bathing: Min guard;Sitting   Lower Body Bathing: Min guard;Sitting/lateral leans   Upper Body Dressing : Min guard;Sitting   Lower Body Dressing: Min guard;Sitting/lateral leans   Toilet Transfer: Min guard;Ambulation Toilet Transfer Details (indicate cue type and reason): pushes IV pole for balance Toileting- Clothing Manipulation and Hygiene: Min guard;Sit to/from stand Toileting - Clothing Manipulation Details (indicate cue type and reason): able to manage peri care and mesh underwear Tub/ Shower Transfer: Walk-in shower;Min guard;Ambulation   Functional mobility during ADLs: Min guard(pushing IV pole) General ADL Comments: DOE, but SpO2 remain above 96% on RA     Vision Baseline Vision/History: Wears glasses Wears Glasses: At all times Patient Visual Report: No change from baseline       Perception     Praxis      Pertinent Vitals/Pain Pain Assessment: No/denies pain     Hand Dominance Right   Extremity/Trunk Assessment Upper Extremity Assessment Upper Extremity Assessment: Overall WFL for tasks assessed   Lower Extremity Assessment Lower Extremity Assessment: Defer to  PT evaluation   Cervical / Trunk Assessment Cervical / Trunk Assessment: Normal   Communication Communication Communication: No difficulties   Cognition Arousal/Alertness: Awake/alert Behavior During Therapy: WFL for tasks assessed/performed Overall Cognitive Status: Within  Functional Limits for tasks assessed                                     General Comments       Exercises     Shoulder Instructions      Home Living Family/patient expects to be discharged to:: Private residence Living Arrangements: Alone Available Help at Discharge: Family;Available PRN/intermittently Type of Home: Apartment Home Access: Stairs to enter Entrance Stairs-Number of Steps: 14+ 7 Entrance Stairs-Rails: Right;Left Home Layout: One level     Bathroom Shower/Tub: Occupational psychologist: Standard Bathroom Accessibility: No   Home Equipment: None          Prior Functioning/Environment Level of Independence: Independent        Comments: pastor and [employer]ully, has a small dog        OT Problem List: Decreased activity tolerance;Impaired balance (sitting and/or standing);Decreased knowledge of use of DME or AE;Cardiopulmonary status limiting activity;Obesity      OT Treatment/Interventions: Self-care/ADL training;Energy conservation;DME and/or AE instruction;Therapeutic activities;Patient/family education;Balance training;Therapeutic exercise    OT Goals(Current goals can be found in the care plan section) Acute Rehab OT Goals Patient Stated Goal: get stronger and go home to her dog OT Goal Formulation: With patient Time For Goal Achievement: 04/24/19 Potential to Achieve Goals: Good ADL Goals Pt Will Perform Grooming: with modified independence;standing Pt Will Perform Upper Body Dressing: with modified independence;sitting Pt Will Perform Lower Body Dressing: with modified independence;sit to/from stand Pt Will Transfer to Toilet: with modified independence;ambulating Pt Will Perform Toileting - Clothing Manipulation and hygiene: with modified independence;sit to/from stand Pt/caregiver will Perform Home Exercise Program: Both right and left upper extremity;With theraband;With written HEP provided Additional ADL Goal  #1: Pt will recall 3 energy conservation strategies for ADL at independent level  OT Frequency: Min 3X/week   Barriers to D/C: Decreased caregiver support          Co-evaluation              AM-PAC OT "6 Clicks" Daily Activity     Outcome Measure Help from another person eating meals?: None Help from another person taking care of personal grooming?: A Little Help from another person toileting, which includes using toliet, bedpan, or urinal?: A Little Help from another person bathing (including washing, rinsing, drying)?: A Little Help from another person to put on and taking off regular upper body clothing?: A Little Help from another person to put on and taking off regular lower body clothing?: None 6 Click Score: 20   End of Session Nurse Communication: Mobility status(IV infusion complete)  Activity Tolerance: Patient tolerated treatment well Patient left: in chair;with call bell/phone within reach  OT Visit Diagnosis: Unsteadiness on feet (R26.81);Muscle weakness (generalized) (M62.81)                Time: 4098-1191 OT Time Calculation (min): 39 min Charges:  OT General Charges $OT Visit: 1 Visit OT Evaluation $OT Eval Moderate Complexity: 1 Mod OT Treatments $Self Care/Home Management : 8-22 mins $Therapeutic Activity: 8-22 mins  Hulda Humphrey OTR/L Acute Rehabilitation Services Pager: [phone] Office: [place] 04/10/2019, 3:27 PM

## 2019-04-11 LAB — CBC WITH DIFFERENTIAL/PLATELET
Abs Immature Granulocytes: 0.42 10*3/uL — ABNORMAL HIGH (ref 0.00–0.07)
Basophils Absolute: 0 10*3/uL (ref 0.0–0.1)
Basophils Relative: 0 %
Eosinophils Absolute: 0 10*3/uL (ref 0.0–0.5)
Eosinophils Relative: 0 %
HCT: 32.9 % — ABNORMAL LOW (ref 36.0–46.0)
Hemoglobin: 10 g/dL — ABNORMAL LOW (ref 12.0–15.0)
Immature Granulocytes: 4 %
Lymphocytes Relative: 23 %
Lymphs Abs: 2.4 10*3/uL (ref 0.7–4.0)
MCH: 23 pg — ABNORMAL LOW (ref 26.0–34.0)
MCHC: 30.4 g/dL (ref 30.0–36.0)
MCV: 75.8 fL — ABNORMAL LOW (ref 80.0–100.0)
Monocytes Absolute: 0.7 10*3/uL (ref 0.1–1.0)
Monocytes Relative: 7 %
Neutro Abs: 7 10*3/uL (ref 1.7–7.7)
Neutrophils Relative %: 66 %
Platelets: 357 10*3/uL (ref 150–400)
RBC: 4.34 MIL/uL (ref 3.87–5.11)
RDW: 16.6 % — ABNORMAL HIGH (ref 11.5–15.5)
WBC: 10.6 10*3/uL — ABNORMAL HIGH (ref 4.0–10.5)
nRBC: 0.4 % — ABNORMAL HIGH (ref 0.0–0.2)

## 2019-04-11 LAB — FERRITIN: Ferritin: 54 ng/mL (ref 11–307)

## 2019-04-11 LAB — COMPREHENSIVE METABOLIC PANEL
ALT: 35 U/L (ref 0–44)
AST: 31 U/L (ref 15–41)
Albumin: 3 g/dL — ABNORMAL LOW (ref 3.5–5.0)
Alkaline Phosphatase: 62 U/L (ref 38–126)
Anion gap: 12 (ref 5–15)
BUN: 20 mg/dL (ref 6–20)
CO2: 23 mmol/L (ref 22–32)
Calcium: 8.4 mg/dL — ABNORMAL LOW (ref 8.9–10.3)
Chloride: 107 mmol/L (ref 98–111)
Creatinine, Ser: 0.72 mg/dL (ref 0.44–1.00)
GFR calc Af Amer: >60 mL/min (ref >60)
GFR calc non Af Amer: >60 mL/min (ref >60)
Glucose, Bld: 76 mg/dL (ref 70–99)
Potassium: 3.8 mmol/L (ref 3.5–5.1)
Sodium: 142 mmol/L (ref 135–145)
Total Bilirubin: 0.1 mg/dL — ABNORMAL LOW (ref 0.3–1.2)
Total Protein: 6.6 g/dL (ref 6.5–8.1)

## 2019-04-11 LAB — D-DIMER, QUANTITATIVE: D-Dimer, Quant: 1.43 ug/mL-FEU — ABNORMAL HIGH (ref 0.00–0.50)

## 2019-04-11 LAB — C-REACTIVE PROTEIN: CRP: 1.3 mg/dL — ABNORMAL HIGH (ref <1.0)

## 2019-04-11 NOTE — Progress Notes (Signed)
No acute events overnight. Patient remained on room air with sats 95-97%. No c/o of pain. Pt received robutussin for cough. AM labs pending.

## 2019-04-11 NOTE — Progress Notes (Signed)
Occupational Therapy Treatment Patient Details Name: Charlene Pearson MRN: [id number] DOB: 1961/08/15 Today's Date: 04/11/2019    History of present illness Charlene Pearson is an 58 y.o. female past medical history significant for hypertension, asthma, gastric bypass in 2009 presents to the ED 04/07/19  with shortness of breath fever that started 12 days prior to admission progressively getting worse over the last 2 days.  She also reports having pain in the right calf that started 2 days prior to admission.  She tested positive at [facility] on 04/04/2019   OT comments  Pt making good progress. Able to complete ADL task with SpO2 96 on RA. 2/4 DOE. Educated on energy conservation and theraband HEP for endurance and strengthening and endurance. Written handouts provided. wil continue to follow acutely. Pt appreciative.   Follow Up Recommendations  No OT follow up;Supervision - Intermittent    Equipment Recommendations  3 in 1 bedside commode    Recommendations for Other Services      Precautions / Restrictions Precautions Precautions: Fall Restrictions Weight Bearing Restrictions: No       Mobility Bed Mobility                  Transfers Overall transfer level: Modified independent                    Balance Overall balance assessment: No apparent balance deficits (not formally assessed)                                         ADL either performed or assessed with clinical judgement   ADL Overall ADL's : Needs assistance/impaired                                     Functional mobility during ADLs: Modified independent General ADL Comments: Pt able to complete ADL with 2/4 DOE. Educated pt on energy conservation strategies. Handout provided.      Vision       Perception     Praxis      Cognition Arousal/Alertness: Awake/alert Behavior During Therapy: WFL for tasks assessed/performed Overall Cognitive  Status: Within Functional Limits for tasks assessed                                          Exercises Exercises: Other exercises;General Upper Extremity General Exercises - Upper Extremity Shoulder Flexion: Strengthening;Both;15 reps;Seated;Theraband Theraband Level (Shoulder Flexion): Level 1 (Yellow) Shoulder Extension: Strengthening;Both;10 reps;Seated;Theraband Theraband Level (Shoulder Extension): Level 1 (Yellow) Shoulder ABduction: Strengthening;Both;10 reps;Seated;Theraband Theraband Level (Shoulder Abduction): Level 1 (Yellow) Elbow Flexion: Strengthening;Both;10 reps;Seated;Theraband Theraband Level (Elbow Flexion): Level 1 (Yellow) Elbow Extension: Strengthening;Both;10 reps;Seated;Theraband Theraband Level (Elbow Extension): Level 1 (Yellow) Other Exercises Other Exercises: incentive spirometer x 10. Able to pull 600 ml   Shoulder Instructions       General Comments      Pertinent Vitals/ Pain       Pain Assessment: No/denies pain  Home Living                                          Prior Functioning/Environment  Frequency  Min 3X/week        Progress Toward Goals  OT Goals(current goals can now be found in the care plan section)  Progress towards OT goals: Progressing toward goals  Acute Rehab OT Goals Patient Stated Goal: get stronger and go home to her dog OT Goal Formulation: With patient Time For Goal Achievement: 04/24/19 Potential to Achieve Goals: Good ADL Goals Pt Will Perform Grooming: with modified independence;standing Pt Will Perform Upper Body Dressing: with modified independence;sitting Pt Will Perform Lower Body Dressing: with modified independence;sit to/from stand Pt Will Transfer to Toilet: with modified independence;ambulating Pt Will Perform Toileting - Clothing Manipulation and hygiene: with modified independence;sit to/from stand Pt/caregiver will Perform Home Exercise  Program: Both right and left upper extremity;With theraband;With written HEP provided Additional ADL Goal #1: Pt will recall 3 energy conservation strategies for ADL at independent level  Plan Discharge plan remains appropriate    Co-evaluation                 AM-PAC OT "6 Clicks" Daily Activity     Outcome Measure   Help from another person eating meals?: None Help from another person taking care of personal grooming?: None Help from another person toileting, which includes using toliet, bedpan, or urinal?: None Help from another person bathing (including washing, rinsing, drying)?: A Little Help from another person to put on and taking off regular upper body clothing?: None Help from another person to put on and taking off regular lower body clothing?: A Little 6 Click Score: 22    End of Session    OT Visit Diagnosis: Unsteadiness on feet (R26.81);Muscle weakness (generalized) (M62.81)   Activity Tolerance Patient tolerated treatment well   Patient Left in chair;with call bell/phone within reach   Nurse Communication Mobility status        Time: [id number] OT Time Calculation (min): 22 min  Charges: OT General Charges $OT Visit: 1 Visit OT Treatments $Self Care/Home Management : 8-22 mins  Luisa DagoHilary Maythe Deramo, OT/L   Acute OT Clinical Specialist Acute Rehabilitation Services Pager [phone] Office [phone]    [facility]ILLARY 04/11/2019, 10:01 AM

## 2019-04-11 NOTE — Progress Notes (Signed)
[facility] PROGRESS NOTE    Progress Note  Charlene BeechamCynthia Pearson  ZOX:[id number] DOB: 09-Apr-1961 DOA: 04/07/2019 PCP: Georgann HousekeeperHusain, Karrar, MD     Brief Narrative:   Charlene Pearson is an 58 y.o. female past medical history significant for hypertension, asthma, gastric bypass in 2009 presents to the ED with shortness of breath fever that started 12 days prior to admission progressively getting worse over the last 2 days.  She also reports having pain in the right calf that started 2 days prior to admission.  She [facility]2019 she was discharged from the ED with a Z-Pak and inhalers.  The ED she was satting 86% was placed on 2 L oxygen and improved to 93, CRP is 14 d-dimer was 1.1 chest x-ray showed bilateral infiltrates she was started on IV Decadron and given Actemra in the ED.  Significant events: Symptoms started 7.6.2020 04/04/2019 [facility]CoV-[facility] 04/07/2019 IV Remdesivir for 5 days 04/07/2019 IV Decadron 04/07/2019 IV Actemra   Assessment/Plan:   Acute on chronic respiratory failure with hypoxia due to  COVID-19 virus infection She is now satting 95% on room air. Inflammatory markers are significantly improved, she relates she is not as anxious as she was yesterday. Her procalcitonin was less than 0.1. Continue Remdesivir IV for a total of 5 days, continue oral Decadron.  Right calf tenderness: With significant elevated d-dimer lower extremity Doppler was negative for DVT.  Essential hypertension: Continue to hold antihypertensive medication, blood pressure stable around the 120s over 70.  Microcytic anemia: Likely iron deficiency, her ferritin is 95 but in the setting of SARS-CoV-2 an elevated CRP her ferritin is likely artificially low she will need a colonoscopy as an outpatient.  DVT prophylaxis: lovenxo Family Communication:none Disposition Plan/Barrier to D/C: When she complete her treatment of  Remdesivir. Code Status:     Code Status Orders  (From admission, onward)         Start     Ordered   04/07/19 1301  Full code  Continuous     04/07/19 1300        Code Status History    This patient has a current code status but no historical code status.   Advance Care Planning Activity        IV Access:    Peripheral IV   Procedures and diagnostic studies:   No results found.   Medical Consultants:    None.  Anti-Infectives:    Subjective:    Charlene Pearson she relates her breathing continues to improve.  Objective:    Vitals:   04/10/19 1621 04/10/19 1959 04/11/19 0000 04/11/19 0435  BP:  128/78 (!) 143/84 126/78  Pulse:  65 61 (!) 57  Resp:  20  19  Temp: 98.3 F (36.8 C) 98.6 F (37 C) 98.7 F (37.1 C) 98.3 F (36.8 C)  TempSrc: Oral Oral Oral Oral  SpO2:  96% 96% 97%  Weight:      Height:       SpO2: 97 % O2 Flow Rate (L/min): 2 L/min   Intake/Output Summary (Last 24 hours) at 04/11/2019 0713 Last data filed at 04/11/2019 0600 Gross per 24 hour  Intake 2160 ml  Output 150 ml  Net 2010 ml   Filed Weights   04/07/19 0852  Weight: 109 kg    Exam: General exam: In no acute distress. Respiratory system: Good air movement and crackles at bases bilaterally more prominent on the right. Cardiovascular system:  S1 & S2 heard, RRR. No JVD. Gastrointestinal system: Abdomen is nondistended, soft and nontender.  Central nervous system: Alert and oriented. No focal neurological deficits. Extremities: No pedal edema. Skin: No rashes, lesions or ulcers Psychiatry: Judgement and insight appear normal. Mood & affect appropriate.   Data Reviewed:    Labs: Basic Metabolic Panel: Recent Labs  Lab 04/07/19 0613 04/08/19 0420 04/09/19 0448 04/10/19 0213 04/11/19 0245  NA 140 141 141 143 142  K 3.4* 3.7 3.6 3.4* 3.8  CL 103 105 104 107 107  CO2 24 23 26 27 23   GLUCOSE 101* 132* 107* 85 76  BUN 10 14 22* 20 20  CREATININE  0.61 0.63 0.72 0.70 0.72  CALCIUM 8.2* 8.5* 8.5* 8.4* 8.4*  MG  --  2.2 2.2 2.0  --   PHOS  --  3.1 3.5 4.1  --    GFR Estimated Creatinine Clearance: 99.3 mL/min (by C-G formula based on SCr of 0.72 mg/dL). Liver Function Tests: Recent Labs  Lab 04/07/19 9509 04/08/19 0420 04/09/19 0448 04/10/19 0213 04/11/19 0245  AST 62* 68* 62* 44* 31  ALT 27 34 41 39 35  ALKPHOS 63 65 60 64 62  BILITOT 0.5 0.2* 0.1* <0.1* 0.1*  PROT 7.5 7.3 7.0 6.7 6.6  ALBUMIN 3.3* 3.0* 2.9* 2.9* 3.0*   No results for input(s): LIPASE, AMYLASE in the last 168 hours. No results for input(s): AMMONIA in the last 168 hours. Coagulation profile No results for input(s): INR, PROTIME in the last 168 hours. COVID-19 Labs  Recent Labs    04/09/19 0448 04/10/19 0213  DDIMER 1.43* 1.35*  FERRITIN 84 72  CRP 5.2* 2.6*    No results found for: SARSCOV2NAA  CBC: Recent Labs  Lab 04/07/19 0613 04/08/19 0420 04/09/19 0448 04/10/19 0213 04/11/19 0245  WBC 7.5 5.5 8.9 9.7 10.6*  NEUTROABS 5.9 3.7 6.3 6.3 7.0  HGB 10.1* 9.9* 9.8* 10.0* 10.0*  HCT 32.6* 31.8* 32.6* 32.0* 32.9*  MCV 76.2* 75.4* 75.5* 74.9* 75.8*  PLT 214 242 282 370 357   Cardiac Enzymes: Recent Labs  Lab 04/08/19 0420 04/09/19 0448 04/10/19 0213  CKTOTAL 721* 347* 199   BNP (last 3 results) No results for input(s): PROBNP in the last 8760 hours. CBG: No results for input(s): GLUCAP in the last 168 hours. D-Dimer: Recent Labs    04/09/19 0448 04/10/19 0213  DDIMER 1.43* 1.35*   Hgb A1c: No results for input(s): HGBA1C in the last 72 hours. Lipid Profile: Recent Labs    04/09/19 0448 04/10/19 0213  TRIG 95 102   Thyroid function studies: No results for input(s): TSH, T4TOTAL, T3FREE, THYROIDAB in the last 72 hours.  Invalid input(s): FREET3 Anemia work up: Recent Labs    04/09/19 0448 04/10/19 0213  FERRITIN 84 72   Sepsis Labs: Recent Labs  Lab 04/07/19 0613 04/08/19 0420 04/09/19 0448 04/10/19 0213  04/11/19 0245  PROCALCITON <0.10  --   --   --   --   WBC 7.5 5.5 8.9 9.7 10.6*  LATICACIDVEN 0.9  --   --   --   --    Microbiology Recent Results (from the past 240 hour(s))  Blood Culture (routine x 2)     Status: None (Preliminary result)   Collection Time: 04/07/19  6:13 AM   Specimen: BLOOD  Result Value Ref Range Status   Specimen Description   Final    BLOOD RIGHT ANTECUBITAL Performed at [facility] [address]  [place], [place] [address]    Special Requests   Final    BOTTLES DRAWN AEROBIC AND ANAEROBIC Blood Culture adequate volume Performed at [facility], [address] [place], [place] [address]    Culture   Final    NO GROWTH 4 DAYS Performed at [facility], [address] [place], [place] [address]    Report Status PENDING  Incomplete  Blood Culture (routine x 2)     Status: None (Preliminary result)   Collection Time: 04/07/19  6:13 AM   Specimen: BLOOD  Result Value Ref Range Status   Specimen Description   Final    BLOOD LEFT ANTECUBITAL Performed at [facility], [address] [place], [place] [address]    Special Requests   Final    BOTTLES DRAWN AEROBIC AND ANAEROBIC Blood Culture adequate volume Performed at [facility], [address] [place], [place] [address]    Culture   Final    NO GROWTH 4 DAYS Performed at [facility], [address] [place], [place] [address]    Report Status PENDING  Incomplete     Medications:    dexamethasone  6 mg Oral Daily   enoxaparin (LOVENOX) injection  50 mg Subcutaneous Q24H   Ipratropium-Albuterol  1 puff Inhalation Q6H   vitamin C  500 mg Oral Daily   zinc sulfate  220 mg Oral Daily   Continuous Infusions:  remdesivir 100 mg in NS 250 mL 100 mg (04/10/19 1020)      LOS: 4 days   Marinda ElkAbraham Feliz Ortiz  [facility]  04/11/2019, 7:13 AM

## 2019-04-11 NOTE — Progress Notes (Signed)
Physical Therapy Treatment Patient Details Name: Charlene Pearson MRN: [id number] DOB: [birth year] Today's Date: 04/11/2019    History of Present Illness Charlene Pearson is an 58 y.o. female past medical history significant for hypertension, asthma, gastric bypass in 2009 presents to the ED 04/07/19  with shortness of breath fever that started 12 days prior to admission progressively getting worse over the last 2 days.  She also reports having pain in the right calf that started 2 days prior to admission.  She tested positive at [facility] on 04/04/2019    PT Comments    Patient had just ambulated from her bathroom independently with incr RR on arrival. Educated on pursed lip breathing and pacing her activity (not rushing!). Verbally educated on climbing the stairs up to her condominium (14, landing, 6 more), continuing her HEP as provided (with emphasis on slow movements and coordinating with her breath), continuing to use her IS, and return to daily walks inside until quarantine lifted. Patient appreciative of education. She states MD said she would be discharged home 04/12/19.     Follow Up Recommendations  No PT follow up     Equipment Recommendations  None recommended by PT    Recommendations for Other Services       Precautions / Restrictions Precautions Precautions: Fall Restrictions Weight Bearing Restrictions: No    Mobility  Bed Mobility               General bed mobility comments: OOB in recliner at beginning and end of session  Transfers Overall transfer level: Modified independent Equipment used: None Transfers: Sit to/from Stand Sit to Stand: Modified independent (Device/Increase time)         General transfer comment: patient stood from recliner using armrests  Ambulation/Gait             General Gait Details: pt with dyspnea upon entering room and reports she just got back from walking herself to the bathroom; denies  imbalance   Stairs Stairs: (Educated pt on step-to technique; unable to practice)           Wheelchair Mobility    Modified Rankin (Stroke Patients Only)       Balance Overall balance assessment: No apparent balance deficits (not formally assessed)                                          Cognition Arousal/Alertness: Awake/alert Behavior During Therapy: WFL for tasks assessed/performed Overall Cognitive Status: Within Functional Limits for tasks assessed                                        Exercises Other Exercises Other Exercises: educated on LE exercise handout and how to modify to standing to challenge her balance    General Comments General comments (skin integrity, edema, etc.): Educated on pursed lip breathing to assist with slowing her rate and increasing her depth of breaths. Pt able to verbalize how often to use her IS. Patient with questions re: returning to her outside walking routine (was up to walking 3 miles) and quarantine rules. Educated that she should walk inside home initially (including due to extreme heat warnings)      Pertinent Vitals/Pain Pain Assessment: No/denies pain    Home Living  Prior Function            PT Goals (current goals can now be found in the care plan section) Acute Rehab PT Goals Patient Stated Goal: get stronger and go home to her dog Time For Goal Achievement: 04/23/19 Potential to Achieve Goals: Good Progress towards PT goals: Progressing toward goals    Frequency    Min 3X/week      PT Plan Current plan remains appropriate    Co-evaluation              AM-PAC PT "6 Clicks" Mobility   Outcome Measure  Help needed turning from your back to your side while in a flat bed without using bedrails?: None Help needed moving from lying on your back to sitting on the side of a flat bed without using bedrails?: None Help needed moving to and  from a bed to a chair (including a wheelchair)?: None Help needed standing up from a chair using your arms (e.g., wheelchair or bedside chair)?: None Help needed to walk in hospital room?: None Help needed climbing 3-5 steps with a railing? : A Little 6 Click Score: 23    End of Session   Activity Tolerance: Patient tolerated treatment well Patient left: in chair;with call bell/phone within reach   PT Visit Diagnosis: Unsteadiness on feet (R26.81)     Time: [id number] PT Time Calculation (min) (ACUTE ONLY): 28 min  Charges:  $Therapeutic Exercise: 8-22 mins $Self Care/Home Management: 8-22                        Zena AmosLynn P Forbes Loll, PT 04/11/2019, 3:40 PM

## 2019-04-12 DIAGNOSIS — J069 Acute upper respiratory infection, unspecified: Secondary | ICD-10-CM

## 2019-04-12 DIAGNOSIS — J9601 Acute respiratory failure with hypoxia: Secondary | ICD-10-CM

## 2019-04-12 LAB — CBC WITH DIFFERENTIAL/PLATELET
Abs Immature Granulocytes: 0.37 10*3/uL — ABNORMAL HIGH (ref 0.00–0.07)
Basophils Absolute: 0 10*3/uL (ref 0.0–0.1)
Basophils Relative: 0 %
Eosinophils Absolute: 0.1 10*3/uL (ref 0.0–0.5)
Eosinophils Relative: 1 %
HCT: 34.5 % — ABNORMAL LOW (ref 36.0–46.0)
Hemoglobin: 10.5 g/dL — ABNORMAL LOW (ref 12.0–15.0)
Immature Granulocytes: 4 %
Lymphocytes Relative: 25 %
Lymphs Abs: 2.7 10*3/uL (ref 0.7–4.0)
MCH: 23.1 pg — ABNORMAL LOW (ref 26.0–34.0)
MCHC: 30.4 g/dL (ref 30.0–36.0)
MCV: 76 fL — ABNORMAL LOW (ref 80.0–100.0)
Monocytes Absolute: 0.5 10*3/uL (ref 0.1–1.0)
Monocytes Relative: 4 %
Neutro Abs: 7.1 10*3/uL (ref 1.7–7.7)
Neutrophils Relative %: 66 %
Platelets: 379 10*3/uL (ref 150–400)
RBC: 4.54 MIL/uL (ref 3.87–5.11)
RDW: 17.1 % — ABNORMAL HIGH (ref 11.5–15.5)
WBC: 10.7 10*3/uL — ABNORMAL HIGH (ref 4.0–10.5)
nRBC: 0.4 % — ABNORMAL HIGH (ref 0.0–0.2)

## 2019-04-12 LAB — CULTURE, BLOOD (ROUTINE X 2)
Culture: NO GROWTH
Culture: NO GROWTH
Special Requests: ADEQUATE
Special Requests: ADEQUATE

## 2019-04-12 LAB — D-DIMER, QUANTITATIVE: D-Dimer, Quant: 1.39 ug/mL-FEU — ABNORMAL HIGH (ref 0.00–0.50)

## 2019-04-12 LAB — FERRITIN: Ferritin: 48 ng/mL (ref 11–307)

## 2019-04-12 LAB — C-REACTIVE PROTEIN: CRP: 0.8 mg/dL (ref <1.0)

## 2019-04-12 LAB — INTERLEUKIN-6, PLASMA: Interleukin-6, Plasma: 48.7 pg/mL — ABNORMAL HIGH (ref 0.0–12.2)

## 2019-04-12 MED ORDER — DEXAMETHASONE 6 MG PO TABS: 6.0000 mg | ORAL_TABLET | Freq: Every day | ORAL | 0 refills | Status: DC

## 2019-04-12 NOTE — Discharge Instructions (Signed)
 COVID-19 COVID-19 is a respiratory infection that is caused by a virus called severe acute respiratory syndrome coronavirus 2 (SARS-CoV-2). The disease is also known as coronavirus disease or novel coronavirus. In some people, the virus may not cause any symptoms. In others, it may cause a serious infection. The infection can get worse quickly and can lead to complications, such as:  Pneumonia, or infection of the lungs.  Acute respiratory distress syndrome or ARDS. This is fluid build-up in the lungs.  Acute respiratory failure. This is a condition in which there is not enough oxygen passing from the lungs to the body.  Sepsis or septic shock. This is a serious bodily reaction to an infection.  Blood clotting problems.  Secondary infections due to bacteria or fungus. The virus that causes COVID-19 is contagious. This means that it can spread from person to person through droplets from coughs and sneezes (respiratory secretions). What are the causes? This illness is caused by a virus. You may catch the virus by:  Breathing in droplets from an infected person's cough or sneeze.  Touching something, like a table or a doorknob, that was exposed to the virus (contaminated) and then touching your mouth, nose, or eyes. What increases the risk? Risk for infection You are more likely to be infected with this virus if you:  Live in or travel to an area with a COVID-19 outbreak.  Come in contact with a sick person who recently traveled to an area with a COVID-19 outbreak.  Provide care for or live with a person who is infected with COVID-19. Risk for serious illness You are more likely to become seriously ill from the virus if you:  Are 65 years of age or older.  Have a long-term disease that lowers your body's ability to fight infection (immunocompromised).  Live in a nursing home or long-term care facility.  Have a long-term (chronic) disease such as: ? Chronic lung disease,  including chronic obstructive pulmonary disease or asthma ? Heart disease. ? Diabetes. ? Chronic kidney disease. ? Liver disease.  Are obese. What are the signs or symptoms? Symptoms of this condition can range from mild to severe. Symptoms may appear any time from 2 to 14 days after being exposed to the virus. They include:  A fever.  A cough.  Difficulty breathing.  Chills.  Muscle pains.  A sore throat.  Loss of taste or smell. Some people may also have stomach problems, such as nausea, vomiting, or diarrhea. Other people may not have any symptoms of COVID-19. How is this diagnosed? This condition may be diagnosed based on:  Your signs and symptoms, especially if: ? You live in an area with a COVID-19 outbreak. ? You recently traveled to or from an area where the virus is common. ? You provide care for or live with a person who was diagnosed with COVID-19.  A physical exam.  Lab tests, which may include: ? A nasal swab to take a sample of fluid from your nose. ? A throat swab to take a sample of fluid from your throat. ? A sample of mucus from your lungs (sputum). ? Blood tests.  Imaging tests, which may include, X-rays, CT scan, or ultrasound. How is this treated? At present, there is no medicine to treat COVID-19. Medicines that treat other diseases are being used on a trial basis to see if they are effective against COVID-19. Your health care provider will talk with you about ways to treat your symptoms. For   most people, the infection is mild and can be managed at home with rest, fluids, and over-the-counter medicines. Treatment for a serious infection usually takes places in a hospital intensive care unit (ICU). It may include one or more of the following treatments. These treatments are given until your symptoms improve.  Receiving fluids and medicines through an IV.  Supplemental oxygen. Extra oxygen is given through a tube in the nose, a face mask, or a  hood.  Positioning you to lie on your stomach (prone position). This makes it easier for oxygen to get into the lungs.  Continuous positive airway pressure (CPAP) or bi-level positive airway pressure (BPAP) machine. This treatment uses mild air pressure to keep the airways open. A tube that is connected to a motor delivers oxygen to the body.  Ventilator. This treatment moves air into and out of the lungs by using a tube that is placed in your windpipe.  Tracheostomy. This is a procedure to create a hole in the neck so that a breathing tube can be inserted.  Extracorporeal membrane oxygenation (ECMO). This procedure gives the lungs a chance to recover by taking over the functions of the heart and lungs. It supplies oxygen to the body and removes carbon dioxide. Follow these instructions at home: Lifestyle  If you are sick, stay home except to get medical care. Your health care provider will tell you how long to stay home. Call your health care provider before you go for medical care.  Rest at home as told by your health care provider.  Do not use any products that contain nicotine or tobacco, such as cigarettes, e-cigarettes, and chewing tobacco. If you need help quitting, ask your health care provider.  Return to your normal activities as told by your health care provider. Ask your health care provider what activities are safe for you. General instructions  Take over-the-counter and prescription medicines only as told by your health care provider.  Drink enough fluid to keep your urine pale yellow.  Keep all follow-up visits as told by your health care provider. This is important. How is this prevented?  There is no vaccine to help prevent COVID-19 infection. However, there are steps you can take to protect yourself and others from this virus. To protect yourself:   Do not travel to areas where COVID-19 is a risk. The areas where COVID-19 is reported change often. To identify  high-risk areas and travel restrictions, check the CDC travel website: wwwnc.cdc.gov/travel/notices  If you live in, or must travel to, an area where COVID-19 is a risk, take precautions to avoid infection. ? Stay away from people who are sick. ? Wash your hands often with soap and water for 20 seconds. If soap and water are not available, use an alcohol-based hand sanitizer. ? Avoid touching your mouth, face, eyes, or nose. ? Avoid going out in public, follow guidance from your state and local health authorities. ? If you must go out in public, wear a cloth face covering or face mask. ? Disinfect objects and surfaces that are frequently touched every day. This may include:  Counters and tables.  Doorknobs and light switches.  Sinks and faucets.  Electronics, such as phones, remote controls, keyboards, computers, and tablets. To protect others: If you have symptoms of COVID-19, take steps to prevent the virus from spreading to others.  If you think you have a COVID-19 infection, contact your health care provider right away. Tell your health care team that you think you   may have a COVID-19 infection.  Stay home. Leave your house only to seek medical care. Do not use public transport.  Do not travel while you are sick.  Wash your hands often with soap and water for 20 seconds. If soap and water are not available, use alcohol-based hand sanitizer.  Stay away from other members of your household. Let healthy household members care for children and pets, if possible. If you have to care for children or pets, wash your hands often and wear a mask. If possible, stay in your own room, separate from others. Use a different bathroom.  Make sure that all people in your household wash their hands well and often.  Cough or sneeze into a tissue or your sleeve or elbow. Do not cough or sneeze into your hand or into the air.  Wear a cloth face covering or face mask. Where to find more  information  Centers for Disease Control and Prevention: www.cdc.gov/coronavirus/2019-ncov/index.html  World Health Organization: www.who.int/health-topics/coronavirus Contact a health care provider if:  You live in or have traveled to an area where COVID-19 is a risk and you have symptoms of the infection.  You have had contact with someone who has COVID-19 and you have symptoms of the infection. Get help right away if:  You have trouble breathing.  You have pain or pressure in your chest.  You have confusion.  You have bluish lips and fingernails.  You have difficulty waking from sleep.  You have symptoms that get worse. These symptoms may represent a serious problem that is an emergency. Do not wait to see if the symptoms will go away. Get medical help right away. Call your local emergency services (911 in the [place] Do not drive yourself to the hospital. Let the emergency medical personnel know if you think you have COVID-19. Summary  COVID-19 is a respiratory infection that is caused by a virus. It is also known as coronavirus disease or novel coronavirus. It can cause serious infections, such as pneumonia, acute respiratory distress syndrome, acute respiratory failure, or sepsis.  The virus that causes COVID-19 is contagious. This means that it can spread from person to person through droplets from coughs and sneezes.  You are more likely to develop a serious illness if you are 65 years of age or older, have a weak immunity, live in a nursing home, or have chronic disease.  There is no medicine to treat COVID-19. Your health care provider will talk with you about ways to treat your symptoms.  Take steps to protect yourself and others from infection. Wash your hands often and disinfect objects and surfaces that are frequently touched every day. Stay away from people who are sick and wear a mask if you are sick. This information is not intended to replace advice given to you by  your health care provider. Make sure you discuss any questions you have with your health care provider. Document Released: 10/14/2018 Document Revised: 02/03/2019 Document Reviewed: 10/14/2018 Elsevier Patient Education  2020 [employer] COVID-19 Frequently Asked Questions COVID-19 (coronavirus disease) is an infection that is caused by a large family of viruses. Some viruses cause illness in people and others cause illness in animals like camels, cats, and bats. In some cases, the viruses that cause illness in animals can spread to humans. Where did the coronavirus come from? In December 2019, [place] told the World Health Organization (WHO) of several cases of lung disease (human respiratory illness). These cases were linked to an open   seafood and livestock market in the city of [place]. The link to the seafood and livestock market suggests that the virus may have spread from animals to humans. However, since that first outbreak in December, the virus has also been shown to spread from person to person. What is the name of the disease and the virus? Disease name Early on, this disease was called novel coronavirus. This is because scientists determined that the disease was caused by a new (novel) respiratory virus. The World Health Organization (WHO) has now named the disease COVID-19, or coronavirus disease. Virus name The virus that causes the disease is called severe acute respiratory syndrome coronavirus 2 (SARS-CoV-2). More information on disease and virus naming World Health Organization (WHO): www.who.int/emergencies/diseases/novel-coronavirus-2019/technical-guidance/naming-the-coronavirus-disease-(covid-2019)-and-the-virus-that-causes-it Who is at risk for complications from coronavirus disease? Some people may be at higher risk for complications from coronavirus disease. This includes older adults and people who have chronic diseases, such as heart disease, diabetes, and lung disease. If you  are at higher risk for complications, take these extra precautions:  Avoid close contact with people who are sick or have a fever or cough. Stay at least 3-6 ft (1-2 m) away from them, if possible.  Wash your hands often with soap and water for at least 20 seconds.  Avoid touching your face, mouth, nose, or eyes.  Keep supplies on hand at home, such as food, medicine, and cleaning supplies.  Stay home as much as possible.  Avoid social gatherings and travel. How does coronavirus disease spread? The virus that causes coronavirus disease spreads easily from person to person (is contagious). There are also cases of community-spread disease. This means the disease has spread to:  People who have no known contact with other infected people.  People who have not traveled to areas where there are known cases. It appears to spread from one person to another through droplets from coughing or sneezing. Can I get the virus from touching surfaces or objects? There is still a lot that we do not know about the virus that causes coronavirus disease. Scientists are basing a lot of information on what they know about similar viruses, such as:  Viruses cannot generally survive on surfaces for long. They need a human body (host) to survive.  It is more likely that the virus is spread by close contact with people who are sick (direct contact), such as through: ? Shaking hands or hugging. ? Breathing in respiratory droplets that travel through the air. This can happen when an infected person coughs or sneezes on or near other people.  It is less likely that the virus is spread when a person touches a surface or object that has the virus on it (indirect contact). The virus may be able to enter the body if the person touches a surface or object and then touches his or her face, eyes, nose, or mouth. Can a person spread the virus without having symptoms of the disease? It may be possible for the virus to  spread before a person has symptoms of the disease, but this is most likely not the main way the virus is spreading. It is more likely for the virus to spread by being in close contact with people who are sick and breathing in the respiratory droplets of a sick person's cough or sneeze. What are the symptoms of coronavirus disease? Symptoms vary from person to person and can range from mild to severe. Symptoms may include:  Fever.  Cough.  Tiredness, weakness, or   fatigue.  Fast breathing or feeling short of breath. These symptoms can appear anywhere from 2 to 14 days after you have been exposed to the virus. If you develop symptoms, call your health care provider. People with severe symptoms may need hospital care. If I am exposed to the virus, how long does it take before symptoms start? Symptoms of coronavirus disease may appear anywhere from 2 to 14 days after a person has been exposed to the virus. If you develop symptoms, call your health care provider. Should I be tested for this virus? Your health care provider will decide whether to test you based on your symptoms, history of exposure, and your risk factors. How does a health care provider test for this virus? Health care providers will collect samples to send for testing. Samples may include:  Taking a swab of fluid from the nose.  Taking fluid from the lungs by having you cough up mucus (sputum) into a sterile cup.  Taking a blood sample.  Taking a stool or urine sample. Is there a treatment or vaccine for this virus? Currently, there is no vaccine to prevent coronavirus disease. Also, there are no medicines like antibiotics or antivirals to treat the virus. A person who becomes sick is given supportive care, which means rest and fluids. A person may also relieve his or her symptoms by using over-the-counter medicines that treat sneezing, coughing, and runny nose. These are the same medicines that a person takes for the common  cold. If you develop symptoms, call your health care provider. People with severe symptoms may need hospital care. What can I do to protect myself and my family from this virus?     You can protect yourself and your family by taking the same actions that you would take to prevent the spread of other viruses. Take the following actions:  Wash your hands often with soap and water for at least 20 seconds. If soap and water are not available, use alcohol-based hand sanitizer.  Avoid touching your face, mouth, nose, or eyes.  Cough or sneeze into a tissue, sleeve, or elbow. Do not cough or sneeze into your hand or the air. ? If you cough or sneeze into a tissue, throw it away immediately and wash your hands.  Disinfect objects and surfaces that you frequently touch every day.  Avoid close contact with people who are sick or have a fever or cough. Stay at least 3-6 ft (1-2 m) away from them, if possible.  Stay home if you are sick, except to get medical care. Call your health care provider before you get medical care.  Make sure your vaccines are up to date. Ask your health care provider what vaccines you need. What should I do if I need to travel? Follow travel recommendations from your local health authority, the CDC, and WHO. Travel information and advice  Centers for Disease Control and Prevention (CDC): www.cdc.gov/coronavirus/2019-ncov/travelers/index.html  World Health Organization (WHO): www.who.int/emergencies/diseases/novel-coronavirus-2019/travel-advice Know the risks and take action to protect your health  You are at higher risk of getting coronavirus disease if you are traveling to areas with an outbreak or if you are exposed to travelers from areas with an outbreak.  Wash your hands often and practice good hygiene to lower the risk of catching or spreading the virus. What should I do if I am sick? General instructions to stop the spread of infection  Wash your hands often  with soap and water for at least 20 seconds. If   soap and water are not available, use alcohol-based hand sanitizer.  Cough or sneeze into a tissue, sleeve, or elbow. Do not cough or sneeze into your hand or the air.  If you cough or sneeze into a tissue, throw it away immediately and wash your hands.  Stay home unless you must get medical care. Call your health care provider or local health authority before you get medical care.  Avoid public areas. Do not take public transportation, if possible.  If you can, wear a mask if you must go out of the house or if you are in close contact with someone who is not sick. Keep your home clean  Disinfect objects and surfaces that are frequently touched every day. This may include: ? Counters and tables. ? Doorknobs and light switches. ? Sinks and faucets. ? Electronics such as phones, remote controls, keyboards, computers, and tablets.  Wash dishes in hot, soapy water or use a dishwasher. Air-dry your dishes.  Wash laundry in hot water. Prevent infecting other household members  Let healthy household members care for children and pets, if possible. If you have to care for children or pets, wash your hands often and wear a mask.  Sleep in a different bedroom or bed, if possible.  Do not share personal items, such as razors, toothbrushes, deodorant, combs, brushes, towels, and washcloths. Where to find more information Centers for Disease Control and Prevention (CDC)  Information and news updates: www.cdc.gov/coronavirus/2019-ncov World Health Organization (WHO)  Information and news updates: www.who.int/emergencies/diseases/novel-coronavirus-2019  Coronavirus health topic: www.who.int/health-topics/coronavirus  Questions and answers on COVID-19: www.who.int/news-room/q-a-detail/q-a-coronaviruses  Global tracker: who.sprinklr.com American Academy of Pediatrics (AAP)  Information for families:  www.healthychildren.org/English/health-issues/conditions/chest-lungs/Pages/2019-Novel-Coronavirus.aspx The coronavirus situation is changing rapidly. Check your local health authority website or the CDC and WHO websites for updates and news. When should I contact a health care provider?  Contact your health care provider if you have symptoms of an infection, such as fever or cough, and you: ? Have been near anyone who is known to have coronavirus disease. ? Have come into contact with a person who is suspected to have coronavirus disease. ? Have traveled outside of the country. When should I get emergency medical care?  Get help right away by calling your local emergency services (911 in the [place] if you have: ? Trouble breathing. ? Pain or pressure in your chest. ? Confusion. ? Blue-tinged lips and fingernails. ? Difficulty waking from sleep. ? Symptoms that get worse. Let the emergency medical personnel know if you think you have coronavirus disease. Summary  A new respiratory virus is spreading from person to person and causing COVID-19 (coronavirus disease).  The virus that causes COVID-19 appears to spread easily. It spreads from one person to another through droplets from coughing or sneezing.  Older adults and those with chronic diseases are at higher risk of disease. If you are at higher risk for complications, take extra precautions.  There is currently no vaccine to prevent coronavirus disease. There are no medicines, such as antibiotics or antivirals, to treat the virus.  You can protect yourself and your family by washing your hands often, avoiding touching your face, and covering your coughs and sneezes. This information is not intended to replace advice given to you by your health care provider. Make sure you discuss any questions you have with your health care provider. Document Released: 01/04/2019 Document Revised: 01/04/2019 Document Reviewed: 01/04/2019 Elsevier  Patient Education  2020 [employer]  

## 2019-04-12 NOTE — Discharge Summary (Signed)
Physician Discharge Summary  Charlene HedgesCynthia Pearson OZH:[id number] DOB: 02/06/1961 DOA: 04/07/2019  PCP: Charlene HousekeeperHusain, Karrar, MD  Admit date: 04/07/2019 Discharge date: 04/12/2019  Admitted From: Home Disposition:  Home  Recommendations for Outpatient Follow-up:  1. Follow up with PCP in 1-2 weeks 2. Please obtain BMP/CBC in one week 3. She will also need to follow-up with her PCP for her iron deficiency anemia schedule her for colonoscopy as an outpatient as she is greater than 89 years old.   Home Health:No Equipment/Devices:None  Discharge Condition:Stable CODE STATUS:Full Diet recommendation: Heart Healthy   Brief/Interim Summary: 58 y.o. female past medical history significant for hypertension, asthma, gastric bypass in 2009 presents to the ED with shortness of breath fever that started 12 days prior to admission progressively getting worse over the last 2 days.  She also reports having pain in the right calf that started 2 days prior to admission.  She tested positive at [facility] on 04/04/2019 she was discharged from the ED with a Z-Pak and inhalers.  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 She was started on IV Remdesivir and Decadron. Her procalcitonin was less than 0.1. She was weaned off oxygen and to room air she completed her treatment of IV Remdesivir. She continue oral Decadron as an outpatient.  Right calf tenderness: With  significant elevated d-dimer lower extremity Doppler was negative for DVT, the elevation in d-dimer was likely due to SARS-CoV-2 infection.  Essential hypertension: No change were made to her medication.  Microcytic anemia: Likely iron deficiency, she is never had a colonoscopy she will need to follow-up with PCP we will schedule colonoscopy as an outpatient.   Discharge Instructions  Discharge Instructions    Diet - low sodium heart healthy   Complete by: As directed    Increase activity slowly   Complete by: As directed      Allergies as of 04/12/2019      Reactions   Amoxicillin Itching   Did it involve swelling of the face/tongue/throat, SOB, or low BP? No Did it involve sudden or severe rash/hives, skin peeling, or any reaction on the inside of your mouth or nose? No Did you need to seek medical attention at a hospital or doctor's office? No When did it last happen?"several years ago" If all above answers are "NO", may proceed with cephalosporin use.   Latex Hives      Medication List    STOP taking these medications   azithromycin 250 MG tablet Commonly known as: ZITHROMAX     TAKE these medications   dexamethasone 6 MG tablet Commonly known as: DECADRON Take 1 tablet (6 mg total) by mouth daily. Start taking on: April 13, 2019   ProAir HFA 108 (90 Base) MCG/ACT inhaler Generic drug: albuterol Inhale 1-2 puffs into the lungs every 6 (six) hours as needed for wheezing or shortness of breath.   valsartan-hydrochlorothiazide 160-12.5 MG tablet Commonly known as: DIOVAN-HCT Take 1 tablet by mouth daily.  Allergies  Allergen Reactions  . Amoxicillin Itching    Did it involve swelling of the face/tongue/throat, SOB, or low BP? No Did it involve sudden or severe rash/hives, skin peeling, or any reaction on the inside of your mouth or nose? No Did you need to seek medical attention at a hospital or doctor's office? No When did it last  happen?"several years ago" If all above answers are "NO", may proceed with cephalosporin use.   . Latex Hives    Consultations:  None   Procedures/Studies: Dg Chest Port 1 View  Result Date: 04/07/2019 CLINICAL DATA:  Shortness of breath.  COVID-19 positive EXAM: PORTABLE CHEST 1 VIEW COMPARISON:  08/16/2017 FINDINGS: Low volume chest with bilateral pulmonary infiltrate and probable superimposed atelectasis. No edema, effusion, or pneumothorax. Normal heart size accounting for rotation and low volumes. IMPRESSION: Bilateral pneumonia correlating with history of COVID-19. Low lung volumes. Electronically Signed   By: Monte Fantasia M.D.   On: 04/07/2019 06:42   Vas [place] Lower Extremity Venous (dvt) (only [facility])  Result Date: 04/07/2019  Lower Venous Study Indications: Swelling.  Risk Factors: COVID 19 positive. Comparison Study: No prior studies Performing Technologist: Oliver Hum RVT  Examination Guidelines: A complete evaluation includes B-mode imaging, spectral Doppler, color Doppler, and power Doppler as needed of all accessible portions of each vessel. Bilateral testing is considered an integral part of a complete examination. Limited examinations for reoccurring indications may be performed as noted.  +---------+---------------+---------+-----------+----------+-------+ RIGHT    CompressibilityPhasicitySpontaneityPropertiesSummary +---------+---------------+---------+-----------+----------+-------+ CFV      Full           Yes      Yes                          +---------+---------------+---------+-----------+----------+-------+ SFJ      Full                                                 +---------+---------------+---------+-----------+----------+-------+ FV Prox  Full                                                 +---------+---------------+---------+-----------+----------+-------+ FV Mid   Full                                                  +---------+---------------+---------+-----------+----------+-------+ FV DistalFull                                                 +---------+---------------+---------+-----------+----------+-------+ PFV      Full                                                 +---------+---------------+---------+-----------+----------+-------+ POP      Full           Yes  Yes                          +---------+---------------+---------+-----------+----------+-------+ PTV      Full                                                 +---------+---------------+---------+-----------+----------+-------+ PERO     Full                                                 +---------+---------------+---------+-----------+----------+-------+   +----+---------------+---------+-----------+----------+-------+ LEFTCompressibilityPhasicitySpontaneityPropertiesSummary +----+---------------+---------+-----------+----------+-------+ CFV Full           Yes      Yes                          +----+---------------+---------+-----------+----------+-------+     Summary: Right: There is no evidence of deep vein thrombosis in the lower extremity. No cystic structure found in the popliteal fossa. Left: No evidence of common femoral vein obstruction.  *See table(s) above for measurements and observations. Electronically signed by Coral ElseVance Brabham MD on 04/07/2019 at 1:13:10 PM.    Final      Subjective: No new complaints.  Discharge Exam: Vitals:   04/12/19 0500 04/12/19 0733  BP: 126/84 (!) 141/86  Pulse:  61  Resp: 18   Temp: 98.4 F (36.9 C) 97.8 F (36.6 C)  SpO2: 99% 95%   Vitals:   04/11/19 1258 04/11/19 2100 04/12/19 0500 04/12/19 0733  BP: (!) 147/79 134/87 126/84 (!) 141/86  Pulse:    61  Resp: 16  18   Temp: 98.6 F (37 C) 98 F (36.7 C) 98.4 F (36.9 C) 97.8 F (36.6 C)  TempSrc: Oral Oral Oral Oral  SpO2:  100% 99% 95%  Weight:      Height:        General: Pt is alert,  awake, not in acute distress Cardiovascular: RRR, S1/S2 +, no rubs, no gallops Respiratory: CTA bilaterally, no wheezing, no rhonchi Abdominal: Soft, NT, ND, bowel sounds + Extremities: no edema, no cyanosis    The results of significant diagnostics from this hospitalization (including imaging, microbiology, ancillary and laboratory) are listed below for reference.     Microbiology: Recent Results (from the past 240 hour(s))  Blood Culture (routine x 2)     Status: None   Collection Time: 04/07/19  6:13 AM   Specimen: BLOOD  Result Value Ref Range Status   Specimen Description   Final    BLOOD RIGHT ANTECUBITAL Performed at [facility], [address] [place], [place] [address]    Special Requests   Final    BOTTLES DRAWN AEROBIC AND ANAEROBIC Blood Culture adequate volume Performed at [facility], [address] [place], [place] [address]    Culture   Final    NO GROWTH 5 DAYS Performed at [facility], [address] [place], [place] [address]    Report Status 04/12/2019 FINAL  Final  Blood Culture (routine x 2)     Status: None   Collection Time: 04/07/19  6:13 AM   Specimen: BLOOD  Result Value Ref Range Status   Specimen Description  Final    BLOOD LEFT ANTECUBITAL Performed at [facility], [address] [place], [place] [address]    Special Requests   Final    BOTTLES DRAWN AEROBIC AND ANAEROBIC Blood Culture adequate volume Performed at [facility], [address] [place], [place] [address]    Culture   Final    NO GROWTH 5 DAYS Performed at [facility], [address] [place], [place] [address]    Report Status 04/12/2019 FINAL  Final     Labs: BNP (last 3 results) No results for input(s): BNP in the last 8760 hours. Basic Metabolic Panel: Recent Labs  Lab 04/07/19 0613 04/08/19 0420 04/09/19 0448 04/10/19 0213 04/11/19 0245  NA 140 141 141 143 142  K 3.4* 3.7  3.6 3.4* 3.8  CL 103 105 104 107 107  CO2 24 23 26 27 23   GLUCOSE 101* 132* 107* 85 76  BUN 10 14 22* 20 20  CREATININE 0.61 0.63 0.72 0.70 0.72  CALCIUM 8.2* 8.5* 8.5* 8.4* 8.4*  MG  --  2.2 2.2 2.0  --   PHOS  --  3.1 3.5 4.1  --    Liver Function Tests: Recent Labs  Lab 04/07/19 0613 04/08/19 0420 04/09/19 0448 04/10/19 0213 04/11/19 0245  AST 62* 68* 62* 44* 31  ALT 27 34 41 39 35  ALKPHOS 63 65 60 64 62  BILITOT 0.5 0.2* 0.1* <0.1* 0.1*  PROT 7.5 7.3 7.0 6.7 6.6  ALBUMIN 3.3* 3.0* 2.9* 2.9* 3.0*   No results for input(s): LIPASE, AMYLASE in the last 168 hours. No results for input(s): AMMONIA in the last 168 hours. CBC: Recent Labs  Lab 04/08/19 0420 04/09/19 0448 04/10/19 0213 04/11/19 0245 04/12/19 0650  WBC 5.5 8.9 9.7 10.6* 10.7*  NEUTROABS 3.7 6.3 6.3 7.0 7.1  HGB 9.9* 9.8* 10.0* 10.0* 10.5*  HCT 31.8* 32.6* 32.0* 32.9* 34.5*  MCV 75.4* 75.5* 74.9* 75.8* 76.0*  PLT 242 282 370 357 379   Cardiac Enzymes: Recent Labs  Lab 04/08/19 0420 04/09/19 0448 04/10/19 0213  CKTOTAL 721* 347* 199   BNP: Invalid input(s): POCBNP CBG: No results for input(s): GLUCAP in the last 168 hours. D-Dimer Recent Labs    04/11/19 0245 04/12/19 0650  DDIMER 1.43* 1.39*   Hgb A1c No results for input(s): HGBA1C in the last 72 hours. Lipid Profile Recent Labs    04/10/19 0213  TRIG 102   Thyroid function studies No results for input(s): TSH, T4TOTAL, T3FREE, THYROIDAB in the last 72 hours.  Invalid input(s): FREET3 Anemia work up [employer]    04/10/19 0213 04/11/19 0245  FERRITIN 72 54   Urinalysis No results found for: COLORURINE, APPEARANCEUR, LABSPEC, PHURINE, GLUCOSEU, HGBUR, BILIRUBINUR, KETONESUR, PROTEINUR, UROBILINOGEN, NITRITE, LEUKOCYTESUR Sepsis Labs Invalid input(s): PROCALCITONIN,  WBC,  LACTICIDVEN Microbiology Recent Results (from the past 240 hour(s))  Blood Culture (routine x 2)     Status: None   Collection Time: 04/07/19  6:13  AM   Specimen: BLOOD  Result Value Ref Range Status   Specimen Description   Final    BLOOD RIGHT ANTECUBITAL Performed at [facility], [address] [place], [place] [address]    Special Requests   Final    BOTTLES DRAWN AEROBIC AND ANAEROBIC Blood Culture adequate volume Performed at [facility], [address] [place], [place] [address]    Culture   Final    NO GROWTH 5 DAYS Performed  at [facility], [address] [place], [place] [address]    Report Status 04/12/2019 FINAL  Final  Blood Culture (routine x 2)     Status: None   Collection Time: 04/07/19  6:13 AM   Specimen: BLOOD  Result Value Ref Range Status   Specimen Description   Final    BLOOD LEFT ANTECUBITAL Performed at [facility], [address] [place], [place] [address]    Special Requests   Final    BOTTLES DRAWN AEROBIC AND ANAEROBIC Blood Culture adequate volume Performed at [facility], [address] [place], [place] [address]    Culture   Final    NO GROWTH 5 DAYS Performed at [facility], [address] [place], [place] [address]    Report Status 04/12/2019 FINAL  Final     Time coordinating discharge: Over 40 minutes  SIGNED:   Marinda Elk, MD  [facility] 04/12/2019, 10:44 AM Pager   If 7PM-7AM, please contact night-coverage [email or web address] Password [email or web address]

## 2019-04-12 NOTE — Progress Notes (Signed)
Pt given and written discharge instruction and printed script. Was made aware of local health dept location and quarantine requirements for Covid infection prevention. IV's removed. Pt has no further questions. Ride will be here between 2-3pm today.

## 2019-04-12 NOTE — Progress Notes (Signed)
Pt declines 3N1 chair per therapy recommendations. States her home bathroom is very close to her bed and does not need it. Pt safely ambulates in room and denies SOB during so. Device not ordered.

## 2019-04-12 NOTE — Progress Notes (Signed)
Pt assisted to vehicle by NT. Has all belongings after throughout inspection of room.

## 2019-04-12 NOTE — Care Management (Signed)
Pt to discharge home today.  Pt verified she is independent from home and will contact PCP once discharged for follow up appt, pt denied barriers with paying for prescription medications.  Pt states she has thermometer already in the home.  Pt denied barriers or concerns with returning home.  CM signing off

## 2019-05-04 ENCOUNTER — Other Ambulatory Visit: Payer: Self-pay

## 2019-05-04 DIAGNOSIS — Z20822 Contact with and (suspected) exposure to covid-19: Secondary | ICD-10-CM

## 2019-05-05 LAB — NOVEL CORONAVIRUS, NAA: SARS-CoV-2, NAA: NOT DETECTED

## 2019-10-13 ENCOUNTER — Other Ambulatory Visit: Payer: Self-pay | Admitting: Internal Medicine

## 2019-10-13 DIAGNOSIS — Z1231 Encounter for screening mammogram for malignant neoplasm of breast: Secondary | ICD-10-CM

## 2019-11-18 ENCOUNTER — Ambulatory Visit
Admission: RE | Admit: 2019-11-18 | Discharge: 2019-11-18 | Disposition: A | Discharge status: Home / Self Care | Payer: 59 | Source: Ambulatory Visit | Attending: Internal Medicine | Admitting: Internal Medicine

## 2019-11-18 ENCOUNTER — Other Ambulatory Visit: Payer: Self-pay

## 2019-11-18 DIAGNOSIS — Z1231 Encounter for screening mammogram for malignant neoplasm of breast: Secondary | ICD-10-CM

## 2019-11-30 ENCOUNTER — Ambulatory Visit: Payer: 59 | Attending: Internal Medicine

## 2019-11-30 DIAGNOSIS — Z20822 Contact with and (suspected) exposure to covid-19: Secondary | ICD-10-CM

## 2019-12-01 LAB — NOVEL CORONAVIRUS, NAA: SARS-CoV-2, NAA: NOT DETECTED

## 2020-02-09 ENCOUNTER — Ambulatory Visit: Payer: 59 | Attending: Family

## 2020-02-09 DIAGNOSIS — Z23 Encounter for immunization: Secondary | ICD-10-CM

## 2020-02-09 NOTE — Progress Notes (Signed)
   Covid-19 Vaccination Clinic  Name:  Andrey Hoobler    MRN: [id number] DOB: Jan 28, 1961  02/09/2020  Ms. Fraga was observed post Covid-19 immunization for 15 minutes without incident. She was provided with Vaccine Information Sheet and instruction to access the V-Safe system.   Ms. Tavano was instructed to call 911 with any severe reactions post vaccine: Marland Kitchen Difficulty breathing  . Swelling of face and throat  . A fast heartbeat  . A bad rash all over body  . Dizziness and weakness   Immunizations Administered    Name Date Dose VIS Date Route   Moderna COVID-19 Vaccine 02/09/2020  3:17 PM 0.5 mL 08/2019 Intramuscular   Manufacturer: Moderna   Lot: [id number]   NDC: 75916-384-66

## 2020-02-22 ENCOUNTER — Ambulatory Visit: Payer: 59 | Attending: Internal Medicine

## 2020-02-22 DIAGNOSIS — Z20822 Contact with and (suspected) exposure to covid-19: Secondary | ICD-10-CM

## 2020-02-23 LAB — SARS-COV-2, NAA 2 DAY TAT

## 2020-02-23 LAB — NOVEL CORONAVIRUS, NAA: SARS-CoV-2, NAA: NOT DETECTED

## 2020-03-08 ENCOUNTER — Ambulatory Visit: Payer: 59

## 2020-03-13 ENCOUNTER — Ambulatory Visit: Payer: 59 | Attending: Internal Medicine

## 2020-03-13 DIAGNOSIS — Z23 Encounter for immunization: Secondary | ICD-10-CM

## 2020-03-13 NOTE — Progress Notes (Signed)
   Covid-19 Vaccination Clinic  Name:  Charlene Pearson    MRN: [id number] DOB: Oct 16, 1960  03/13/2020  Charlene Pearson was observed post Covid-19 immunization for 15 minutes without incident. She was provided with Vaccine Information Sheet and instruction to access the V-Safe system.   Charlene Pearson was instructed to call 911 with any severe reactions post vaccine: Marland Kitchen Difficulty breathing  . Swelling of face and throat  . A fast heartbeat  . A bad rash all over body  . Dizziness and weakness   Immunizations Administered    Name Date Dose VIS Date Route   Moderna COVID-19 Vaccine 03/13/2020  1:24 PM 0.5 mL 08/2019 Intramuscular   Manufacturer: Moderna   Lot: [id number]   NDC: 20919-802-21

## 2020-04-05 ENCOUNTER — Inpatient Hospital Stay: Payer: 59

## 2020-04-05 ENCOUNTER — Other Ambulatory Visit: Payer: Self-pay

## 2020-04-05 ENCOUNTER — Inpatient Hospital Stay: Payer: 59 | Attending: Nurse Practitioner | Admitting: Nurse Practitioner

## 2020-04-05 ENCOUNTER — Encounter: Payer: Self-pay | Admitting: Nurse Practitioner

## 2020-04-05 VITALS — BP 148/82 | HR 73 | Temp 97.7°F | Resp 17 | Ht 66.0 in | Wt 251.6 lb

## 2020-04-05 DIAGNOSIS — E669 Obesity, unspecified: Secondary | ICD-10-CM | POA: Insufficient documentation

## 2020-04-05 DIAGNOSIS — J45909 Unspecified asthma, uncomplicated: Secondary | ICD-10-CM | POA: Insufficient documentation

## 2020-04-05 DIAGNOSIS — I10 Essential (primary) hypertension: Secondary | ICD-10-CM | POA: Diagnosis not present

## 2020-04-05 DIAGNOSIS — D649 Anemia, unspecified: Secondary | ICD-10-CM

## 2020-04-05 DIAGNOSIS — Z9884 Bariatric surgery status: Secondary | ICD-10-CM | POA: Insufficient documentation

## 2020-04-05 DIAGNOSIS — D509 Iron deficiency anemia, unspecified: Secondary | ICD-10-CM | POA: Insufficient documentation

## 2020-04-05 DIAGNOSIS — E119 Type 2 diabetes mellitus without complications: Secondary | ICD-10-CM | POA: Diagnosis not present

## 2020-04-05 DIAGNOSIS — Z79899 Other long term (current) drug therapy: Secondary | ICD-10-CM | POA: Insufficient documentation

## 2020-04-05 DIAGNOSIS — D573 Sickle-cell trait: Secondary | ICD-10-CM | POA: Diagnosis not present

## 2020-04-05 LAB — CBC WITH DIFFERENTIAL (CANCER CENTER ONLY)
Abs Immature Granulocytes: 0.02 10*3/uL (ref 0.00–0.07)
Basophils Absolute: 0.1 10*3/uL (ref 0.0–0.1)
Basophils Relative: 1 %
Eosinophils Absolute: 0.2 10*3/uL (ref 0.0–0.5)
Eosinophils Relative: 4 %
HCT: 33.9 % — ABNORMAL LOW (ref 36.0–46.0)
Hemoglobin: 10.5 g/dL — ABNORMAL LOW (ref 12.0–15.0)
Immature Granulocytes: 0 %
Lymphocytes Relative: 42 %
Lymphs Abs: 2.5 10*3/uL (ref 0.7–4.0)
MCH: 22.6 pg — ABNORMAL LOW (ref 26.0–34.0)
MCHC: 31 g/dL (ref 30.0–36.0)
MCV: 73.1 fL — ABNORMAL LOW (ref 80.0–100.0)
Monocytes Absolute: 0.4 10*3/uL (ref 0.1–1.0)
Monocytes Relative: 6 %
Neutro Abs: 2.8 10*3/uL (ref 1.7–7.7)
Neutrophils Relative %: 47 %
Platelet Count: 302 10*3/uL (ref 150–400)
RBC: 4.64 MIL/uL (ref 3.87–5.11)
RDW: 18 % — ABNORMAL HIGH (ref 11.5–15.5)
WBC Count: 6 10*3/uL (ref 4.0–10.5)
nRBC: 0 % (ref 0.0–0.2)

## 2020-04-05 LAB — RETIC PANEL
Immature Retic Fract: 23.4 % — ABNORMAL HIGH (ref 2.3–15.9)
RBC.: 4.6 MIL/uL (ref 3.87–5.11)
Retic Count, Absolute: 49.2 10*3/uL (ref 19.0–186.0)
Retic Ct Pct: 1.1 % (ref 0.4–3.1)
Reticulocyte Hemoglobin: 27.5 pg — ABNORMAL LOW (ref >27.9)

## 2020-04-05 LAB — FOLATE: Folate: 33.8 ng/mL (ref >5.9)

## 2020-04-05 LAB — VITAMIN B12: Vitamin B-12: 2796 pg/mL — ABNORMAL HIGH (ref 180–914)

## 2020-04-05 NOTE — Progress Notes (Addendum)
[place]  Telephone:(336) [phone] Fax:(336) [place] consult Note   Patient Care Team: [name], MD as PCP - General (Internal Medicine) 04/05/2020  CHIEF COMPLAINTS/PURPOSE OF CONSULTATION:  Anemia, referred by PCP Dr. [name]  HISTORY OF PRESENTING ILLNESS:  Charlene Pearson 59 y.o. female with history of sickle cell trait, HTN, pre-DM, asthma, COVID19 infection, and obesity s/p gastric bypass in 2009 is here because of anemia. She was found to have abnormal CBC from 03/29/2008, previous records are not available to me, with Hgb 10.5.  Next CBC in epic on 04/07/2019 during hospitalization for covid19 infection showed Hgb 10.1, HCT 32.6, MCV 76.2, and RDW mildly elevated at 16.8.  Anemia work-up on 04/08/2019 showed elevated vitamin B12 to 5070, normal folate 33.7, normal serum iron of 42, TIBC 289, 15% transferrin saturation and normal ferritin of 95.  TSH was normal at 1.97 on 07/03/2019. more recent labs at PCP on 02/17/2020 showed Hgb 10.5, HCT 32.8, MCV 70.7, RDW 18.2.  She had normal platelet and WBC.  She was started on oral iron 1/day at that time.  Follow-up CBC on 03/21/2020 showed Hgb 10.7, HCT 33.4, MCV 71.9, and RDW 19.0.  She is reporting extreme fatigue where she has to push herself to continue normal ADLs.  She has minimal dyspnea on exertion, no cough, syncope, palpitations, or chest pain.  She had one episode of epistaxis after first [place] vaccine, denies other bleeding such as hematuria or hematochezia.  She takes occasional ibuprofen, not on other antiplatelet medication.  She eats a normal diet with meat.  Last colonoscopy in 12/2019 per Dr. Collene Mares, the report is not available to me.  She is up-to-date on her mammogram.  No longer having menses due to hysterectomy.  Denies pica, denies history of blood transfusion.  She has been on oral iron since 01/2020, did not tolerate tablets which contributed to stomach pain.  She tolerates liquid  formulation much better.  Socially, she is widowed, husband deceased from multiple myeloma.  She lives alone.  Independent with all ADLs.  She has 3 adult healthy children, 1 son and 2 daughters.  She is self-employed, works for [employer].  She denies alcohol, tobacco, or other drug use.  Denies family history of blood disorder or cancer.  Today, she presents alone.  Denies recent bleeding.  Has normal bowel movements.  No recent fever or infection.  Her only complaint is fatigue  MEDICAL HISTORY:  Past Medical History:  Diagnosis Date  . Asthma   . Hypertension   . Varicose veins     SURGICAL HISTORY: Past Surgical History:  Procedure Laterality Date  . ROUX-EN-Y PROCEDURE  2009    SOCIAL HISTORY: Social History   Socioeconomic History  . Marital status: Widowed    Spouse name: Not on file  . Number of children: Not on file  . Years of education: Not on file  . Highest education level: Not on file  Occupational History  . Not on file  Tobacco Use  . Smoking status: Never Smoker  . Smokeless tobacco: Never Used  Substance and Sexual Activity  . Alcohol use: No  . Drug use: No  . Sexual activity: Not on file  Other Topics Concern  . Not on file  Social History Narrative  . Not on file   Social Determinants of Health   Financial Resource Strain:   . Difficulty of Paying Living Expenses:   Food Insecurity:   .  Worried About Charity fundraiser in the Last Year:   . Arboriculturist in the Last Year:   Transportation Needs:   . Film/video editor (Medical):   Marland Kitchen Lack of Transportation (Non-Medical):   Physical Activity:   . Days of Exercise per Week:   . Minutes of Exercise per Session:   Stress:   . Feeling of Stress :   Social Connections:   . Frequency of Communication with Friends and Family:   . Frequency of Social Gatherings with Friends and Family:   . Attends Religious Services:   . Active Member of Clubs or Organizations:   . Attends Theatre manager Meetings:   Marland Kitchen Marital Status:   Intimate Partner Violence:   . Fear of Current or Ex-Partner:   . Emotionally Abused:   Marland Kitchen Physically Abused:   . Sexually Abused:     FAMILY HISTORY: History reviewed. No pertinent family history.  ALLERGIES:  is allergic to amoxicillin and latex.  MEDICATIONS:  Current Outpatient Medications  Medication Sig Dispense Refill  . PROAIR HFA 108 (90 BASE) MCG/ACT inhaler Inhale 1-2 puffs into the lungs every 6 (six) hours as needed for wheezing or shortness of breath.     . valsartan-hydrochlorothiazide (DIOVAN-HCT) 160-12.5 MG per tablet Take 1 tablet by mouth daily.    Marland Kitchen dexamethasone (DECADRON) 6 MG tablet Take 1 tablet (6 mg total) by mouth daily. (Patient not taking: Reported on 04/05/2020) 5 tablet 0   No current facility-administered medications for this visit.    REVIEW OF SYSTEMS:   Constitutional: Denies fevers, chills or abnormal night sweats (+) extreme fatigue (+) weight gain Eyes: Denies blurriness of vision, double vision or watery eyes Ears, nose, mouth, throat, and face: Denies mucositis or sore throat (+) epistaxis x1 after Covid vaccine Respiratory: Denies cough, dyspnea or wheezes (+) minimal DOE Cardiovascular: Denies palpitation, chest discomfort or lower extremity swelling Gastrointestinal:  Denies nausea, heartburn or change in bowel habits Skin: Denies abnormal skin rashes Lymphatics: Denies new lymphadenopathy or easy bruising Neurological:Denies numbness, tingling or new weaknesses (+) hot flashes Behavioral/Psych: Mood is stable, no new changes  All other systems were reviewed with the patient and are negative.  PHYSICAL EXAMINATION: ECOG PERFORMANCE STATUS: 1 - Symptomatic but completely ambulatory  Vitals:   04/05/20 1412  BP: (!) 148/82  Pulse: 73  Resp: 17  Temp: 97.7 F (36.5 C)  SpO2: 99%   Filed Weights   04/05/20 1412  Weight: 251 lb 9.6 oz (114.1 kg)    GENERAL:alert, no distress and  comfortable SKIN: No rash to exposed skin EYES: sclera clear NECK: Without mass LUNGS: clear with normal breathing effort HEART: regular rate & rhythm, no lower extremity edema ABDOMEN:abdomen soft, non-tender and normal bowel sounds PSYCH: alert & oriented x 3 with fluent speech NEURO: no focal motor/sensory deficits  LABORATORY DATA:  I have reviewed the data as listed CBC Latest Ref Rng & Units 04/12/2019 04/11/2019 04/10/2019  WBC 4.0 - 10.5 K/uL 10.7(H) 10.6(H) 9.7  Hemoglobin 12.0 - 15.0 g/dL 10.5(L) 10.0(L) 10.0(L)  Hematocrit 36 - 46 % 34.5(L) 32.9(L) 32.0(L)  Platelets 150 - 400 K/uL 379 357 370    CMP Latest Ref Rng & Units 04/11/2019 04/10/2019 04/09/2019  Glucose 70 - 99 mg/dL 76 85 107(H)  BUN 6 - 20 mg/dL 20 20 22(H)  Creatinine 0.44 - 1.00 mg/dL 0.72 0.70 0.72  Sodium 135 - 145 mmol/L 142 143 141  Potassium 3.5 - 5.1 mmol/L  3.8 3.4(L) 3.6  Chloride 98 - 111 mmol/L 107 107 104  CO2 22 - 32 mmol/L _0 Calcium 8.9 - 10.3 mg/dL 8.4(L) 8.4(L) 8.5(L)  Total Protein 6.5 - 8.1 g/dL 6.6 6.7 7.0  Total Bilirubin 0.3 - 1.2 mg/dL 0.1(L) <0.1(L) 0.1(L)  Alkaline Phos 38 - 126 U/L 62 64 60  AST 15 - 41 U/L 31 44(H) 62(H)  ALT 0 - 44 U/L 35 39 41     RADIOGRAPHIC STUDIES: I have personally reviewed the radiological images as listed and agreed with the findings in the report. No results found.  ASSESSMENT & PLAN: 59 yo female with  1. Chronic anemia  -We reviewed her medical record in detail, she has a chronic history of mild microcytic, hypochromic anemia with Hgb in the 10 range dating back at least to 2009 when she underwent gastric bypass.  - Previous work-up with TSH, D53, folic acid, and iron were normal in 2020 -No obvious bleeding, no longer having menses; she is up-to-date on age-appropriate cancer screenings.  No evidence of GI bleeding on colonoscopy in 11/2019 per Dr. Collene Mares -She has been on oral iron since 01/2020, tolerating liquid formulation -We reviewed the  typical etiologies of anemia and discussed hers is possibly related to chronic disease, iron deficiency from poor dietary absorption from gastric bypass vs microscopic bleeding, or this is her baseline from sickle cell trait. She has no other cytopenias or constitutional symptoms, low suspicion for bone marrow disease.  -we have requested labs from PCP prior to 2009/gastric bypass to see if this is her baseline. -we will obtain labs today to update her CBC, iron studies, B12, folate, MMA, copper, and retic panel.  -She will continue oral iron in the meantime, if she has severe IDA we would consider IV iron. I discussed the risk/benefit including potential severe allergic reaction. She agrees to proceed if necessary.  -If she does have iron deficiency and anemia does not improve/resolve with adequate supplementation, she may require additional studies.  -f/u pending further work up.   2. Obesity, health maintenance  -She is s/p roux-en-Y gastric bypass in 03/28/2008 -she gained weight back over the last year due to stress from her husband passing away and covid19 pandemic.  -She has been referred back to weight loss management by Dr. Lysle Rubens.  -She is UTD on mammogram which was negative in 11/2019. Colonoscopy on 11/30/19 showed 2 sessile polyps in the sigmoid colon, otherwise normal, recall 7-10 years per Dr. Collene Mares. She is s/p hysterectomy.   2. HTN, pre-DM, asthma, sickle cell trait, h/o covid19 infection 03/2019 -followed by PCP  PLAN: -Medical record, outside lab, colonoscopy reviewed  -Lab today -F/u pending results -Requested CBC/iron studies prior to 2009   Orders Placed This Encounter  Procedures  . CBC with Differential (Cancer Center Only)    Standing Status:   Standing    Number of Occurrences:   20    Standing Expiration Date:   04/05/2021  . Retic Panel    Standing Status:   Standing    Number of Occurrences:   10    Standing Expiration Date:   04/05/2021  . Iron and TIBC     Standing Status:   Standing    Number of Occurrences:   20    Standing Expiration Date:   04/05/2021  . Ferritin    Standing Status:   Standing    Number of Occurrences:   20    Standing Expiration Date:  04/05/2021  . Folate, Serum    Standing Status:   Standing    Number of Occurrences:   1    Standing Expiration Date:   04/05/2021  . Vitamin B12    Standing Status:   Standing    Number of Occurrences:   1    Standing Expiration Date:   04/05/2021  . Methylmalonic acid, serum    Standing Status:   Standing    Number of Occurrences:   1    Standing Expiration Date:   04/05/2021  . Copper, serum    Standing Status:   Standing    Number of Occurrences:   1    Standing Expiration Date:   04/05/2021     All questions were answered. The patient knows to call the clinic with any problems, questions or concerns.     Alla Feeling, NP 04/05/20   Addendum  I have seen the patient, examined her. I agree with the assessment and and plan and have edited the notes.   Ms Hodgens was referred for chronic anemia. Per patient, she has sickle trait, she does have microcytic anemia. Her hemoglobin has been around 10-11 since her bariatric surgery in 2009. Unfortunately we do not have her old CBC before 2009, will try to get it from her PCP Dr. Lysle Rubens. Her previous iron study including ferritin and TIBC were normal. I think her anemia is probably related to her gastric bypass surgery. We will check reticulocyte count, iron study, N16, folic acid, methylmalonic acid, copper level, to rule out nutritional anemia. Given the stable mild anemia since 2009, this is unlikely primary bone marrow disease, such as MDS or multiple myeloma. If she did have mild anemia before 2009, this could be partially related to her sickle trait. I do not think she needs a bone marrow biopsy or other anemia work-up at this point. We will call her next week with the lab results.   Truitt Merle  04/05/2020

## 2020-04-06 ENCOUNTER — Telehealth: Payer: Self-pay | Admitting: Nurse Practitioner

## 2020-04-06 LAB — IRON AND TIBC
Iron: 26 ug/dL — ABNORMAL LOW (ref 41–142)
Saturation Ratios: 7 % — ABNORMAL LOW (ref 21–57)
TIBC: 376 ug/dL (ref 236–444)
UIBC: 350 ug/dL (ref 120–384)

## 2020-04-06 LAB — FERRITIN: Ferritin: 5 ng/mL — ABNORMAL LOW (ref 11–307)

## 2020-04-06 NOTE — Telephone Encounter (Signed)
No 7/15 los 

## 2020-04-07 LAB — COPPER, SERUM: Copper: 130 ug/dL (ref 80–158)

## 2020-04-08 LAB — METHYLMALONIC ACID, SERUM: Methylmalonic Acid, Quantitative: 143 nmol/L (ref 0–378)

## 2020-04-10 ENCOUNTER — Other Ambulatory Visit: Payer: Self-pay | Admitting: Pharmacist

## 2020-04-10 ENCOUNTER — Telehealth: Payer: Self-pay

## 2020-04-10 DIAGNOSIS — D509 Iron deficiency anemia, unspecified: Secondary | ICD-10-CM | POA: Insufficient documentation

## 2020-04-10 NOTE — Telephone Encounter (Signed)
-----   Message from Pollyann Samples, NP sent at 04/10/2020 12:51 PM EDT ----- Please let her know lab results. Anemia is stable at her baseline 10.5. She does not have B12, folic acid, or copper deficiencies. Her iron is low, ferritin <5 and she is symptomatic. I recommend IV iron to get her levels up. She and I discussed potential for allergic reaction but she previously agreed to IV iron if necessary. If she still agrees, let me know and I will have schedulers call her.    Thanks, Clayborn Heron

## 2020-04-10 NOTE — Telephone Encounter (Signed)
Spoke with patient relayed lab results and asked if in agreement for IV iron pt agrees Np aware scheduling to follow up for appt

## 2020-04-11 ENCOUNTER — Telehealth: Payer: Self-pay | Admitting: Nurse Practitioner

## 2020-04-11 NOTE — Telephone Encounter (Signed)
Scheduled appt per 7/20 sch msg - pt is aware of appts added   

## 2020-04-16 ENCOUNTER — Inpatient Hospital Stay: Payer: 59 | Attending: Nurse Practitioner

## 2020-04-16 ENCOUNTER — Ambulatory Visit: Payer: 59

## 2020-04-16 ENCOUNTER — Other Ambulatory Visit: Payer: Self-pay

## 2020-04-16 VITALS — BP 135/65 | HR 63 | Temp 98.6°F | Resp 18 | Wt 251.8 lb

## 2020-04-16 DIAGNOSIS — Z79899 Other long term (current) drug therapy: Secondary | ICD-10-CM | POA: Diagnosis not present

## 2020-04-16 DIAGNOSIS — D509 Iron deficiency anemia, unspecified: Secondary | ICD-10-CM | POA: Insufficient documentation

## 2020-04-16 MED ORDER — SODIUM CHLORIDE 0.9 % IV SOLN
Freq: Once | INTRAVENOUS | Status: AC
  Filled 2020-04-16: qty 250

## 2020-04-16 MED ORDER — SODIUM CHLORIDE 0.9 % IV SOLN
200.0000 mg | Freq: Once | INTRAVENOUS | Status: AC
  Administered 2020-04-16: 200 mg via INTRAVENOUS
  Filled 2020-04-16: qty 200

## 2020-04-16 NOTE — Patient Instructions (Signed)

## 2020-04-18 ENCOUNTER — Telehealth: Payer: Self-pay | Admitting: Nurse Practitioner

## 2020-04-18 NOTE — Telephone Encounter (Signed)
Scheduled appointments per 7/27 scheduling message. Patient is aware of appointments date and times. I also reminded patient of Infusion appointments scheduled in August. She is aware of those appointments dates and times as well.

## 2020-04-25 ENCOUNTER — Other Ambulatory Visit: Payer: Self-pay

## 2020-04-25 ENCOUNTER — Inpatient Hospital Stay: Payer: 59 | Attending: Nurse Practitioner

## 2020-04-25 VITALS — BP 120/66 | HR 62 | Temp 98.4°F | Resp 16

## 2020-04-25 DIAGNOSIS — Z79899 Other long term (current) drug therapy: Secondary | ICD-10-CM | POA: Diagnosis not present

## 2020-04-25 DIAGNOSIS — D509 Iron deficiency anemia, unspecified: Secondary | ICD-10-CM | POA: Diagnosis not present

## 2020-04-25 MED ORDER — SODIUM CHLORIDE 0.9 % IV SOLN
200.0000 mg | Freq: Once | INTRAVENOUS | Status: AC
  Administered 2020-04-25: 200 mg via INTRAVENOUS
  Filled 2020-04-25: qty 200

## 2020-04-25 MED ORDER — SODIUM CHLORIDE 0.9 % IV SOLN
Freq: Once | INTRAVENOUS | Status: AC
  Filled 2020-04-25: qty 250

## 2020-04-25 NOTE — Patient Instructions (Signed)

## 2020-04-27 ENCOUNTER — Inpatient Hospital Stay: Payer: 59

## 2020-04-27 ENCOUNTER — Other Ambulatory Visit: Payer: Self-pay

## 2020-04-27 VITALS — BP 132/73 | HR 61 | Temp 98.3°F | Resp 16

## 2020-04-27 DIAGNOSIS — D509 Iron deficiency anemia, unspecified: Secondary | ICD-10-CM

## 2020-04-27 MED ORDER — SODIUM CHLORIDE 0.9 % IV SOLN
200.0000 mg | Freq: Once | INTRAVENOUS | Status: AC
  Administered 2020-04-27: 200 mg via INTRAVENOUS
  Filled 2020-04-27: qty 200

## 2020-04-27 MED ORDER — SODIUM CHLORIDE 0.9 % IV SOLN
Freq: Once | INTRAVENOUS | Status: AC
  Filled 2020-04-27: qty 250

## 2020-04-27 NOTE — Patient Instructions (Signed)

## 2020-05-02 ENCOUNTER — Inpatient Hospital Stay: Payer: 59

## 2020-05-02 ENCOUNTER — Other Ambulatory Visit: Payer: Self-pay

## 2020-05-02 VITALS — BP 121/64 | HR 72 | Temp 98.2°F | Resp 17

## 2020-05-02 DIAGNOSIS — D509 Iron deficiency anemia, unspecified: Secondary | ICD-10-CM | POA: Diagnosis not present

## 2020-05-02 MED ORDER — SODIUM CHLORIDE 0.9 % IV SOLN
Freq: Once | INTRAVENOUS | Status: AC
  Filled 2020-05-02: qty 250

## 2020-05-02 MED ORDER — SODIUM CHLORIDE 0.9 % IV SOLN
200.0000 mg | Freq: Once | INTRAVENOUS | Status: AC
  Administered 2020-05-02: 200 mg via INTRAVENOUS
  Filled 2020-05-02: qty 200

## 2020-05-02 NOTE — Patient Instructions (Signed)

## 2020-05-04 ENCOUNTER — Other Ambulatory Visit: Payer: Self-pay

## 2020-05-04 ENCOUNTER — Inpatient Hospital Stay: Payer: 59

## 2020-05-04 VITALS — BP 125/69 | HR 62 | Temp 97.9°F | Resp 18

## 2020-05-04 DIAGNOSIS — D509 Iron deficiency anemia, unspecified: Secondary | ICD-10-CM

## 2020-05-04 MED ORDER — SODIUM CHLORIDE 0.9 % IV SOLN
200.0000 mg | Freq: Once | INTRAVENOUS | Status: AC
  Administered 2020-05-04: 200 mg via INTRAVENOUS
  Filled 2020-05-04: qty 200

## 2020-05-04 MED ORDER — SODIUM CHLORIDE 0.9 % IV SOLN
Freq: Once | INTRAVENOUS | Status: AC
  Filled 2020-05-04: qty 250

## 2020-05-04 NOTE — Patient Instructions (Signed)

## 2020-08-06 NOTE — Progress Notes (Signed)
[facility]   Telephone:(336) [phone] Fax:(336) [phone]   Clinic Follow up Note   Patient Care Team: Charlene Housekeeper, MD as PCP - General (Internal Medicine) 08/07/2020  CHIEF COMPLAINT: Follow-up anemia  CURRENT THERAPY: IV Venofer 200 mg as needed  INTERVAL HISTORY: Charlene Pearson returns for follow-up as scheduled.  She was seen initially in 03/2020, received IV Venofer 200 mg x 5 from 04/16/2020-05/04/2020.  After iron infusions her energy improved and she felt better, but in the last week or so she has had more fatigue.   She continues oral liquid iron once daily with food. She continues walking and intentional weight loss.  She was dancing at an event recently and felt a little winded, otherwise no cough, chest pain, dyspnea, headache, dizziness, bleeding, or recent infections.  She declined the flu vaccine.   MEDICAL HISTORY:  Past Medical History:  Diagnosis Date  . Asthma   . Hypertension   . Varicose veins     SURGICAL HISTORY: Past Surgical History:  Procedure Laterality Date  . ROUX-EN-Y PROCEDURE  2009    I have reviewed the social history and family history with the patient and they are unchanged from previous note.  ALLERGIES:  is allergic to amoxicillin and latex.  MEDICATIONS:  Current Outpatient Medications  Medication Sig Dispense Refill  . PROAIR HFA 108 (90 BASE) MCG/ACT inhaler Inhale 1-2 puffs into the lungs every 6 (six) hours as needed for wheezing or shortness of breath.     . valsartan-hydrochlorothiazide (DIOVAN-HCT) 160-12.5 MG per tablet Take 1 tablet by mouth daily.    Marland Kitchen dexamethasone (DECADRON) 6 MG tablet Take 1 tablet (6 mg total) by mouth daily. (Patient not taking: Reported on 04/05/2020) 5 tablet 0   No current facility-administered medications for this visit.    PHYSICAL EXAMINATION: ECOG PERFORMANCE STATUS: 1 - Symptomatic but completely ambulatory  Vitals:   08/07/20 0850  BP: (!) 154/85  Pulse: 62  Resp: 18  Temp:  (!) 97.5 F (36.4 C)  SpO2: 97%   Filed Weights   08/07/20 0850  Weight: 240 lb 6.4 oz (109 kg)    GENERAL:alert, no distress and comfortable SKIN: No rash EYES: sclera clear LUNGS:  normal breathing effort HEART:  no lower extremity edema NEURO: alert & oriented x 3 with fluent speech  LABORATORY DATA:  I have reviewed the data as listed CBC Latest Ref Rng & Units 08/07/2020 04/05/2020 04/12/2019  WBC 4.0 - 10.5 K/uL 5.9 6.0 10.7(H)  Hemoglobin 12.0 - 15.0 g/dL 81.8 10.5(L) 10.5(L)  Hematocrit 36 - 46 % 37.6 33.9(L) 34.5(L)  Platelets 150 - 400 K/uL 237 302 379    RADIOGRAPHIC STUDIES: I have personally reviewed the radiological images as listed and agreed with the findings in the report. No results found.   ASSESSMENT & PLAN: 59 yo female with  1. Chronic anemia, IDA secondary to poor dietary absorption -She has a chronic history of mild microcytic, hypochromic anemia with Hgb in the 10 range dating back at least to 2009 when she underwent gastric bypass.  - Previous work-up with TSH, B12, folic acid, and iron were normal in 2020 - No evidence of GI bleeding on colonoscopy in 11/2019 per Dr. Loreta Ave -She presented with recurrent symptomatic anemia and 03/2020, work-up showed normal B12, folic acid, copper.  Iron studies consistent with IDA likely from poor dietary absorption from gastric bypass. -S/p IV Venofer 200 mg x 5 completed in 04/2020.  Symptoms improved -Labs today show normal hemoglobin  and iron panel, she responded very well to IV iron, IDA resolved -Continue oral iron and monitor labs  2. Obesity, health maintenance  -She is s/p roux-en-Y gastric bypass in 03/28/2008 -she gained weight back over the last year due to stress from her husband passing away and covid19 pandemic.  -She has been referred back to weight loss management by Dr. Donette Larry.  -She is UTD on mammogram which was negative in 11/2019. Colonoscopy on 11/30/19 showed 2 sessile polyps in the sigmoid colon,  otherwise normal, recall 7-10 years per Dr. Loreta Ave. She is s/p hysterectomy.  -Continue intentional weight loss  3. HTN, pre-DM, asthma, sickle cell trait, h/o covid19 infection 03/2019 -followed by PCP  Disposition: Charlene Pearson appears stable.  She completed IV Venofer x5 doses in 04/2020 and continues oral iron, tolerating well.  Her symptoms improved after IV iron.  CBC today shows normal hemoglobin, iron studies normalized.  She responded very well to IV iron, IDA resolved.  I recommend to continue oral iron, repeat iron studies in 4 months, follow-up in 8 months.  She knows to call sooner if she has signs of recurrent anemia or other new concerns.  All questions were answered. The patient knows to call the clinic with any problems, questions or concerns. No barriers to learning were detected.     Charlene Samples, NP 08/07/20

## 2020-08-07 ENCOUNTER — Inpatient Hospital Stay (HOSPITAL_BASED_OUTPATIENT_CLINIC_OR_DEPARTMENT_OTHER): Payer: 59 | Admitting: Nurse Practitioner

## 2020-08-07 ENCOUNTER — Other Ambulatory Visit: Payer: Self-pay

## 2020-08-07 ENCOUNTER — Encounter: Payer: Self-pay | Admitting: Nurse Practitioner

## 2020-08-07 ENCOUNTER — Inpatient Hospital Stay: Payer: 59 | Attending: Nurse Practitioner

## 2020-08-07 ENCOUNTER — Telehealth: Payer: Self-pay

## 2020-08-07 VITALS — BP 154/85 | HR 62 | Temp 97.5°F | Resp 18 | Ht 66.0 in | Wt 240.4 lb

## 2020-08-07 DIAGNOSIS — Z79899 Other long term (current) drug therapy: Secondary | ICD-10-CM | POA: Insufficient documentation

## 2020-08-07 DIAGNOSIS — D649 Anemia, unspecified: Secondary | ICD-10-CM

## 2020-08-07 DIAGNOSIS — J45909 Unspecified asthma, uncomplicated: Secondary | ICD-10-CM | POA: Insufficient documentation

## 2020-08-07 DIAGNOSIS — E669 Obesity, unspecified: Secondary | ICD-10-CM | POA: Insufficient documentation

## 2020-08-07 DIAGNOSIS — D573 Sickle-cell trait: Secondary | ICD-10-CM | POA: Insufficient documentation

## 2020-08-07 DIAGNOSIS — E119 Type 2 diabetes mellitus without complications: Secondary | ICD-10-CM | POA: Diagnosis not present

## 2020-08-07 DIAGNOSIS — Z8616 Personal history of COVID-19: Secondary | ICD-10-CM | POA: Diagnosis not present

## 2020-08-07 DIAGNOSIS — D509 Iron deficiency anemia, unspecified: Secondary | ICD-10-CM | POA: Diagnosis not present

## 2020-08-07 DIAGNOSIS — Z9884 Bariatric surgery status: Secondary | ICD-10-CM | POA: Diagnosis not present

## 2020-08-07 DIAGNOSIS — I10 Essential (primary) hypertension: Secondary | ICD-10-CM | POA: Diagnosis not present

## 2020-08-07 LAB — CBC WITH DIFFERENTIAL (CANCER CENTER ONLY)
Abs Immature Granulocytes: 0.01 10*3/uL (ref 0.00–0.07)
Basophils Absolute: 0.1 10*3/uL (ref 0.0–0.1)
Basophils Relative: 1 %
Eosinophils Absolute: 0.2 10*3/uL (ref 0.0–0.5)
Eosinophils Relative: 3 %
HCT: 37.6 % (ref 36.0–46.0)
Hemoglobin: 12.4 g/dL (ref 12.0–15.0)
Immature Granulocytes: 0 %
Lymphocytes Relative: 35 %
Lymphs Abs: 2.1 10*3/uL (ref 0.7–4.0)
MCH: 26.3 pg (ref 26.0–34.0)
MCHC: 33 g/dL (ref 30.0–36.0)
MCV: 79.8 fL — ABNORMAL LOW (ref 80.0–100.0)
Monocytes Absolute: 0.5 10*3/uL (ref 0.1–1.0)
Monocytes Relative: 8 %
Neutro Abs: 3.1 10*3/uL (ref 1.7–7.7)
Neutrophils Relative %: 53 %
Platelet Count: 237 10*3/uL (ref 150–400)
RBC: 4.71 MIL/uL (ref 3.87–5.11)
RDW: 15.6 % — ABNORMAL HIGH (ref 11.5–15.5)
WBC Count: 5.9 10*3/uL (ref 4.0–10.5)
nRBC: 0 % (ref 0.0–0.2)

## 2020-08-07 LAB — RETIC PANEL
Immature Retic Fract: 13.4 % (ref 2.3–15.9)
RBC.: 4.68 MIL/uL (ref 3.87–5.11)
Retic Count, Absolute: 51.9 10*3/uL (ref 19.0–186.0)
Retic Ct Pct: 1.1 % (ref 0.4–3.1)
Reticulocyte Hemoglobin: 31.8 pg (ref >27.9)

## 2020-08-07 LAB — IRON AND TIBC
Iron: 65 ug/dL (ref 41–142)
Saturation Ratios: 23 % (ref 21–57)
TIBC: 288 ug/dL (ref 236–444)
UIBC: 223 ug/dL (ref 120–384)

## 2020-08-07 LAB — FERRITIN: Ferritin: 65 ng/mL (ref 11–307)

## 2020-08-07 NOTE — Telephone Encounter (Signed)
Pt called labs results given....... (IDA resolved, iron panel is normal. Continue oral iron. Return for lab in 4 months, then lab and f/u in 8 months. Please call us sooner if she has signs of recurrent anemia.)..... along with appt notes scheduling message sent

## 2020-08-08 ENCOUNTER — Telehealth: Payer: Self-pay | Admitting: Nurse Practitioner

## 2020-08-08 NOTE — Telephone Encounter (Signed)
Scheduled per 11/16 los. Pt is aware of appt times and date. 

## 2020-10-01 ENCOUNTER — Other Ambulatory Visit: Payer: 59

## 2020-10-01 ENCOUNTER — Other Ambulatory Visit: Payer: Self-pay

## 2020-10-02 ENCOUNTER — Ambulatory Visit: Payer: 59 | Attending: Internal Medicine

## 2020-10-02 DIAGNOSIS — Z23 Encounter for immunization: Secondary | ICD-10-CM

## 2020-10-02 NOTE — Progress Notes (Signed)
   Covid-19 Vaccination Clinic  Name:  Charlene Pearson    MRN: [id number] DOB: 10-06-1960  10/02/2020  Charlene Pearson was observed post Covid-19 immunization for 15 minutes without incident. She was provided with Vaccine Information Sheet and instruction to access the V-Safe system.   Charlene Pearson was instructed to call 911 with any severe reactions post vaccine: Marland Kitchen Difficulty breathing  . Swelling of face and throat  . A fast heartbeat  . A bad rash all over body  . Dizziness and weakness   Immunizations Administered    Name Date Dose VIS Date Route   Moderna Covid-19 Booster Vaccine 10/02/2020  2:43 PM 0.25 mL 07/11/2020 Intramuscular   Manufacturer: Gala Murdoch   Lot: [id number]   NDC: 02217-981-02

## 2020-10-03 ENCOUNTER — Other Ambulatory Visit: Payer: 59

## 2020-10-03 DIAGNOSIS — Z20822 Contact with and (suspected) exposure to covid-19: Secondary | ICD-10-CM

## 2020-10-05 LAB — SARS-COV-2, NAA 2 DAY TAT

## 2020-10-05 LAB — NOVEL CORONAVIRUS, NAA: SARS-CoV-2, NAA: NOT DETECTED

## 2020-10-15 ENCOUNTER — Encounter (INDEPENDENT_AMBULATORY_CARE_PROVIDER_SITE_OTHER): Payer: Self-pay | Admitting: Bariatrics

## 2020-10-15 ENCOUNTER — Ambulatory Visit (INDEPENDENT_AMBULATORY_CARE_PROVIDER_SITE_OTHER): Payer: 59 | Admitting: Bariatrics

## 2020-10-15 ENCOUNTER — Other Ambulatory Visit: Payer: Self-pay

## 2020-10-15 VITALS — BP 142/88 | HR 67 | Temp 98.2°F | Ht 66.0 in | Wt 236.0 lb

## 2020-10-15 DIAGNOSIS — I10 Essential (primary) hypertension: Secondary | ICD-10-CM

## 2020-10-15 DIAGNOSIS — E559 Vitamin D deficiency, unspecified: Secondary | ICD-10-CM

## 2020-10-15 DIAGNOSIS — R0602 Shortness of breath: Secondary | ICD-10-CM

## 2020-10-15 DIAGNOSIS — Z9884 Bariatric surgery status: Secondary | ICD-10-CM | POA: Diagnosis not present

## 2020-10-15 DIAGNOSIS — R5383 Other fatigue: Secondary | ICD-10-CM | POA: Diagnosis not present

## 2020-10-15 DIAGNOSIS — Z9189 Other specified personal risk factors, not elsewhere classified: Secondary | ICD-10-CM | POA: Diagnosis not present

## 2020-10-15 DIAGNOSIS — E66812 Obesity, class 2: Secondary | ICD-10-CM

## 2020-10-15 DIAGNOSIS — R7303 Prediabetes: Secondary | ICD-10-CM

## 2020-10-15 DIAGNOSIS — Z1331 Encounter for screening for depression: Secondary | ICD-10-CM

## 2020-10-15 DIAGNOSIS — Z6838 Body mass index (BMI) 38.0-38.9, adult: Secondary | ICD-10-CM

## 2020-10-15 DIAGNOSIS — D509 Iron deficiency anemia, unspecified: Secondary | ICD-10-CM | POA: Diagnosis not present

## 2020-10-15 DIAGNOSIS — Z0289 Encounter for other administrative examinations: Secondary | ICD-10-CM

## 2020-10-16 ENCOUNTER — Encounter (INDEPENDENT_AMBULATORY_CARE_PROVIDER_SITE_OTHER): Payer: Self-pay | Admitting: Bariatrics

## 2020-10-16 DIAGNOSIS — Z6838 Body mass index (BMI) 38.0-38.9, adult: Secondary | ICD-10-CM | POA: Insufficient documentation

## 2020-10-16 DIAGNOSIS — E6609 Other obesity due to excess calories: Secondary | ICD-10-CM | POA: Insufficient documentation

## 2020-10-16 DIAGNOSIS — E559 Vitamin D deficiency, unspecified: Secondary | ICD-10-CM | POA: Insufficient documentation

## 2020-10-16 LAB — CBC WITH DIFFERENTIAL/PLATELET
Basophils Absolute: 0.1 10*3/uL (ref 0.0–0.2)
Basos: 1 %
EOS (ABSOLUTE): 0.1 10*3/uL (ref 0.0–0.4)
Eos: 3 %
Hematocrit: 41.7 % (ref 34.0–46.6)
Hemoglobin: 13.5 g/dL (ref 11.1–15.9)
Immature Grans (Abs): 0 10*3/uL (ref 0.0–0.1)
Immature Granulocytes: 1 %
Lymphocytes Absolute: 2 10*3/uL (ref 0.7–3.1)
Lymphs: 40 %
MCH: 26.7 pg (ref 26.6–33.0)
MCHC: 32.4 g/dL (ref 31.5–35.7)
MCV: 83 fL (ref 79–97)
Monocytes Absolute: 0.4 10*3/uL (ref 0.1–0.9)
Monocytes: 7 %
Neutrophils Absolute: 2.5 10*3/uL (ref 1.4–7.0)
Neutrophils: 48 %
Platelets: 168 10*3/uL (ref 150–450)
RBC: 5.05 x10E6/uL (ref 3.77–5.28)
RDW: 14 % (ref 11.7–15.4)
WBC: 5.1 10*3/uL (ref 3.4–10.8)

## 2020-10-16 LAB — HEMOGLOBIN A1C
Est. average glucose Bld gHb Est-mCnc: 111 mg/dL
Hgb A1c MFr Bld: 5.5 % (ref 4.8–5.6)

## 2020-10-16 LAB — INSULIN, RANDOM: INSULIN: 14.2 u[IU]/mL (ref 2.6–24.9)

## 2020-10-16 LAB — VITAMIN D 25 HYDROXY (VIT D DEFICIENCY, FRACTURES): Vit D, 25-Hydroxy: 28.7 ng/mL — ABNORMAL LOW (ref 30.0–100.0)

## 2020-10-16 NOTE — Progress Notes (Signed)
Dear Dr. Ezzard StandingNewman,   Thank you for referring Charlene BertinCynthia Pearson to our clinic. The following note includes my evaluation and treatment recommendations.  Chief Complaint:   OBESITY Charlene BertinCynthia Pearson (MR# [id number]) is a 60 y.o. female who presents for evaluation and treatment of obesity and related comorbidities. Current BMI is Body mass index is 38.09 kg/m. Charlene BeechamCynthia has been struggling with her weight for many years and has been unsuccessful in either losing weight, maintaining weight loss, or reaching her healthy weight goal.  Charlene BeechamCynthia is currently in the action stage of change and ready to dedicate time achieving and maintaining a healthier weight. Charlene BeechamCynthia is interested in becoming our patient and working on intensive lifestyle modifications including (but not limited to) diet and exercise for weight loss.  Charlene BeechamCynthia says she struggles with snacking and sometimes skips meals.  Charlene Pearson's habits were reviewed today and are as follows: she thinks her family will eat healthier with her, her desired weight loss is 40 pounds, she has been heavy most of her life, she started gaining weight at 60 years old, her heaviest weight ever was 279 pounds, she is a picky eater and doesn't like to eat healthier foods, she craves cookies, she snacks frequently in the evenings, she wakes up frequently in the middle of the night to eat, she skips lunch frequently, she is trying to follow a vegetarian diet, she is trying to follow a vegan diet, she frequently makes poor food choices, she has problems with excessive hunger and she struggles with emotional eating.  Depression Screen Charlene Pearson (modified PHQ-9) score was 4.  Depression screen Charlene Pearson 2/9 10/15/2020  Decreased Interest 0  Down, Depressed, Hopeless 0  PHQ - 2 Score 0  Altered sleeping 1  Tired, decreased energy 1  Change in appetite 1  Feeling bad or failure about yourself  0  Trouble concentrating 1  Moving slowly or fidgety/restless 0   Suicidal thoughts 0  PHQ-9 Score 4  Difficult doing work/chores Not difficult at all   Subjective:   1. Other fatigue Charlene BeechamCynthia admits to daytime somnolence and reports waking up still tired. Charlene Pearson has a history of symptoms of daytime fatigue, morning fatigue, morning headache and snoring. Charlene BeechamCynthia generally gets 6 hours of sleep per night, and states that she has poor quality sleep. Snoring is present. Apneic episodes are not present. Epworth Sleepiness Score is 12.  2. SOB (shortness of breath) on exertion Charlene BeechamCynthia notes increasing shortness of breath with exercising and seems to be worsening over time with weight gain. She notes getting out of breath sooner with activity than she used to. This has gotten worse recently. Charlene BeechamCynthia denies shortness of breath at rest or orthopnea.  3. [facility] of Roux[facility].  No complications.  She has restrictions.  Lowest weight 190 pounds.  Highest weight 279 pounds.  Referred by Bariatric Surgery, Dr. Ezzard StandingNewman.  4. 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  She is seeing Hematology and has had iron infusions.  Last CBC was on 08/07/2020.  CBC Latest Ref Rng & Units 10/15/2020 08/07/2020 04/05/2020  WBC 3.4 - 10.8 x10E3/uL 5.1 5.9 6.0  Hemoglobin 11.1 - 15.9 g/dL 40.913.5 81.112.4 10.5(L)  Hematocrit 34.0 - 46.6 % 41.7 37.6 33.9(L)  Platelets 150 - 450 x10E3/uL 168 237 302   Lab Results  Component Value Date   IRON 65 08/07/2020   TIBC 288 08/07/2020   FERRITIN 65 08/07/2020  Lab Results  Component Value Date   VITAMINB12 2,796 (H) 04/05/2020   5. Essential hypertension Controlled.  Review: taking medications as instructed, no medication side effects noted, no chest pain on exertion, no dyspnea on exertion, no swelling of ankles.  She is taking valsartan.  BP Readings from Last 3 Encounters:  10/15/20 (!) 142/88  08/07/20 (!) 154/85  05/04/20 125/69   6.  Prediabetes Charlene Pearson has a diagnosis of prediabetes based on her elevated HgA1c and was informed this puts her at greater risk of developing diabetes. She continues to work on diet and exercise to decrease her risk of diabetes. She denies nausea or hypoglycemia.  She is on no medications.  Family history of diabetes in mother and son.  Lab Results  Component Value Date   HGBA1C 5.5 10/15/2020   Lab Results  Component Value Date   INSULIN 14.2 10/15/2020   7. Vitamin D deficiency She is currently taking OTC vitamin D. She denies nausea, vomiting or muscle weakness.  8. Depression screening Charlene Pearson was screened for depression as part of her new patient workup today.  PHQ-9 is 4.  9. At risk for activity intolerance Charlene Pearson is at risk for activity intolerance due to obesity, fatigue, and shortness of breath.  Assessment/Plan:   1. Other fatigue Charlene Pearson does feel that her weight is causing her energy to be lower than it should be. Fatigue may be related to obesity, depression or many other causes. Labs will be ordered, and in the meanwhile, Tannya will focus on self care including making healthy food choices, increasing physical activity and focusing on stress reduction.  - Hemoglobin A1c - VITAMIN D 25 Hydroxy (Vit-D Deficiency, Fractures) - EKG 12-Lead - Insulin, random  2. SOB (shortness of breath) on exertion Charlene Pearson does feel that she gets out of breath more easily that she used to when she exercises. Charlene Pearson shortness of breath appears to be obesity related and exercise induced. She has agreed to work on weight loss and gradually increase exercise to treat her exercise induced shortness of breath. Will continue to monitor closely.  3. History of Roux-en-Y gastric bypass Charlene Pearson is at risk for malnutrition due to her previous bariatric surgery.   Counseling  You may need to eat 3 meals and 2 snacks, or 5 small meals each day in order to reach your protein and calorie goals.    Allow at least 15 minutes for each meal so that you can eat mindfully. Listen to your body so that you Pearson not overeat. For most people, your sleeve or pouch will comfortably hold 4-6 ounces.  Eat foods from all food groups. This includes fruits and vegetables, grains, dairy, and meat and other proteins.  Include a protein-rich food at every meal and snack, and eat the protein food first.   You should be taking a Bariatric Multivitamin as well as calcium.   4. Iron deficiency anemia, unspecified iron deficiency anemia type PCP and hematologist to follow.  Will check CBC today.   Counseling . Iron is essential for our bodies to make red blood cells.  Reasons that someone may be deficient include: an iron-deficient diet (more likely in those following vegan or vegetarian diets), women with heavy menses, patients with GI disorders or poor absorption, patients that have had bariatric surgery, frequent blood donors, patients with cancer, and patients with heart disease.   Charlene Pearson foods include dark leafy greens, red and white meats, eggs, seafood, and beans.   . Certain foods and  drinks prevent your body from absorbing iron properly. Avoid eating these foods in the same meal as iron-rich foods or with iron supplements. These foods include: coffee, black tea, and red wine; milk, dairy products, and foods that are high in calcium; beans and soybeans; whole grains.  . Constipation can be a side effect of iron supplementation. Increased water and fiber intake are helpful. Water goal: > 2 liters/day. Fiber goal: > 25 grams/day.  - CBC with Differential/Platelet  5. Essential hypertension Charlene Pearson is working on healthy weight loss and exercise to improve blood pressure control. We will watch for signs of hypotension as she continues her lifestyle modifications.  Continue medication.  6. Prediabetes Charlene Pearson will continue to work on weight loss, exercise, and decreasing simple carbohydrates to help  decrease the risk of diabetes.  Will check A1c and insulin level today.  - Hemoglobin A1c - Insulin, random  7. Vitamin D deficiency Low Vitamin D level contributes to fatigue and are associated with obesity, breast, and colon cancer. Will check vitamin D level today.  - VITAMIN D 25 Hydroxy (Vit-D Deficiency, Fractures)  8. Depression screening Depression screening is negative.  9. At risk for activity intolerance Charlene Pearson was given approximately 15 minutes of exercise intolerance counseling today. She is 60 y.o. female and has risk factors exercise intolerance including obesity. We discussed intensive lifestyle modifications today with an emphasis on specific weight loss instructions and strategies. Charlene Pearson will slowly increase activity as tolerated.  Repetitive spaced learning was employed today to elicit superior memory formation and behavioral change.  10. Class 2 severe obesity with serious comorbidity and body mass index (BMI) of 38.0 to 38.9 in adult, unspecified obesity type (HCC)  Charlene Pearson is currently in the action stage of change and her goal is to continue with weight loss efforts. I recommend Charlene Pearson begin the structured treatment plan as follows:  She has agreed to the Category 2 Plan.  She will work on meal planning, eating small, frequent meals, and she was given a Protein Equivalents sheet.  Labs were reviewed from 03/2020, including vitamin B12, copper, and methylmalonic acid, and from 08/07/2020, including anemia panel.  Exercise goals: No exercise has been prescribed at this time.   Behavioral modification strategies: increasing lean protein intake, decreasing simple carbohydrates, increasing vegetables, increasing water intake, decreasing eating out, no skipping meals, meal planning and cooking strategies, keeping healthy foods in the home and planning for success.  She was informed of the importance of frequent follow-up visits to maximize her success with intensive  lifestyle modifications for her multiple health conditions. She was informed we would discuss her lab results at her next visit unless there is a critical issue that needs to be addressed sooner. Charlene Pearson agreed to keep her next visit at the agreed upon time to discuss these results.  Objective:   Blood pressure (!) 142/88, pulse 67, temperature 98.2 F (36.8 C), temperature source Oral, height 5\' 6"  (1.676 m), weight 236 lb (107 kg), SpO2 97 %. Body mass index is 38.09 kg/m.  EKG: Normal sinus rhythm, rate normal .  Indirect Calorimeter completed today shows a VO2 of 219 and a REE of 1527.  Her calculated basal metabolic rate is thus her basal metabolic rate is worse than expected.  General: Cooperative, alert, well developed, in no acute distress. HEENT: Conjunctivae and lids unremarkable. Cardiovascular: Regular rhythm.  Lungs: Normal work of breathing. Neurologic: No focal deficits.   Lab Results  Component Value Date   CREATININE 0.72 04/11/2019  BUN 20 04/11/2019   NA 142 04/11/2019   K 3.8 04/11/2019   CL 107 04/11/2019   CO2 23 04/11/2019   Lab Results  Component Value Date   ALT 35 04/11/2019   AST 31 04/11/2019   ALKPHOS 62 04/11/2019   BILITOT 0.1 (L) 04/11/2019   Lab Results  Component Value Date   HGBA1C 5.5 10/15/2020   Lab Results  Component Value Date   INSULIN 14.2 10/15/2020   Lab Results  Component Value Date   TRIG 102 04/10/2019   Lab Results  Component Value Date   WBC 5.1 10/15/2020   HGB 13.5 10/15/2020   HCT 41.7 10/15/2020   MCV 83 10/15/2020   PLT 168 10/15/2020   Lab Results  Component Value Date   IRON 65 08/07/2020   TIBC 288 08/07/2020   FERRITIN 65 08/07/2020   Attestation Statements:   Reviewed by clinician on day of visit: allergies, medications, problem list, medical history, surgical history, family history, social history, and previous encounter notes.  I, Insurance claims handler, CMA, am acting as Energy manager for  [employer], Pearson  I have reviewed the above documentation for accuracy and completeness, and I agree with the above. Corinna Capra, Pearson

## 2020-10-26 ENCOUNTER — Other Ambulatory Visit: Payer: Self-pay | Admitting: Psychiatry

## 2020-10-26 ENCOUNTER — Other Ambulatory Visit: Payer: Self-pay | Admitting: Internal Medicine

## 2020-10-26 DIAGNOSIS — Z1231 Encounter for screening mammogram for malignant neoplasm of breast: Secondary | ICD-10-CM

## 2020-10-29 ENCOUNTER — Ambulatory Visit (INDEPENDENT_AMBULATORY_CARE_PROVIDER_SITE_OTHER): Payer: 59 | Admitting: Bariatrics

## 2020-10-29 ENCOUNTER — Other Ambulatory Visit: Payer: Self-pay

## 2020-10-29 ENCOUNTER — Encounter (INDEPENDENT_AMBULATORY_CARE_PROVIDER_SITE_OTHER): Payer: Self-pay | Admitting: Bariatrics

## 2020-10-29 VITALS — BP 135/80 | HR 68 | Temp 98.0°F | Ht 66.0 in | Wt 239.0 lb

## 2020-10-29 DIAGNOSIS — E559 Vitamin D deficiency, unspecified: Secondary | ICD-10-CM

## 2020-10-29 DIAGNOSIS — Z6838 Body mass index (BMI) 38.0-38.9, adult: Secondary | ICD-10-CM

## 2020-10-29 DIAGNOSIS — E8881 Metabolic syndrome: Secondary | ICD-10-CM

## 2020-10-29 DIAGNOSIS — Z9189 Other specified personal risk factors, not elsewhere classified: Secondary | ICD-10-CM

## 2020-10-29 DIAGNOSIS — I10 Essential (primary) hypertension: Secondary | ICD-10-CM | POA: Diagnosis not present

## 2020-10-29 MED ORDER — VITAMIN D (ERGOCALCIFEROL) 1.25 MG (50000 UNIT) PO CAPS: 50000.0000 [IU] | ORAL_CAPSULE | ORAL | 0 refills | Status: DC

## 2020-10-30 NOTE — Progress Notes (Signed)
Chief Complaint:   OBESITY Charlene Pearson is here to discuss her progress with her obesity treatment plan along with follow-up of her obesity related diagnoses. Charlene Pearson is on the Category 2 Plan and states she is following her eating plan approximately 80% of the time. Charlene Pearson states she is doing 0 minutes 0 times per week.  Today's visit was #: 2 Starting weight: 236 lbs Starting date: 10/15/2020 Today's weight: 239 lbs Today's date: 10/29/2020 Total lbs lost to date: 0 Total lbs lost since last in-office visit: 0  Interim History: Charlene Pearson is up 3 lbs since her last visit. She states that she has been out of town and also eating more meals with sodium. Her body water is up about 6.5 lbs per our bioimpedance scale.  Subjective:   1. Essential hypertension Charlene Pearson's blood pressure is well controlled. No added medications at this time.  2. Vitamin D deficiency Charlene Pearson is taking OTC Vit D, and last Vit D level was 28.7.  3. Insulin resistance Charlene Pearson notes increased appetite. Last insulin was 14.2 and A1c 5.5.  4. At risk of diabetes mellitus Charlene Pearson is at higher than average risk for developing diabetes due to obesity and insulin resistance.   Assessment/Plan:   1. Essential hypertension Charlene Pearson will continue her medications, and increase water, and work on healthy weight loss and exercise to improve blood pressure control. No added salt. We will watch for signs of hypotension as she continues her lifestyle modifications.  2. Vitamin D deficiency Low Vitamin D level contributes to fatigue and are associated with obesity, breast, and colon cancer. Charlene Pearson agreed to start prescription Vitamin D 50,000 IU every week with no refills. She will follow-up for routine testing of Vitamin D, at least 2-3 times per year to avoid over-replacement.  3. Insulin resistance Charlene Pearson will continue to work on weight loss, exercise, and decreasing simple carbohydrates to help decrease the risk of  diabetes. We discussed metformin and insulin resistance. Charlene Pearson agreed to follow-up with [place] as directed to closely monitor her progress.  4. At risk of diabetes mellitus Charlene Pearson was given approximately 15 minutes of diabetes education and counseling today. We discussed intensive lifestyle modifications today with an emphasis on weight loss as well as increasing exercise and decreasing simple carbohydrates in her diet. We also reviewed medication options with an emphasis on risk versus benefit of those discussed.   Repetitive spaced learning was employed today to elicit superior memory formation and behavioral change.  5. Class 2 severe obesity due to excess calories with serious comorbidity and body mass index (BMI) of 38.0 to 38.9 in adult [facility]) Charlene Pearson is currently in the action stage of change. As such, her goal is to continue with weight loss efforts. She has agreed to the Category 2 Plan.   Charlene Pearson will decrease sodium, and no added salt. Prepare meals. I reviewed labs with the patient today from 10/15/2020. Eating out handout was provided.  Exercise goals: Charlene Pearson will resume walking and on the treadmill.  Behavioral modification strategies: increasing lean protein intake, decreasing simple carbohydrates, increasing vegetables, increasing water intake, decreasing eating out, no skipping meals, meal planning and cooking strategies, keeping healthy foods in the home and planning for success.  Charlene Pearson has agreed to follow-up with our clinic in 2 weeks. She was informed of the importance of frequent follow-up visits to maximize her success with intensive lifestyle modifications for her multiple health conditions.   Objective:   Blood pressure 135/80, pulse 68, temperature 98 F (36.7  C), height 5\' 6"  (1.676 m), weight 239 lb (108.4 kg), SpO2 97 %. Body mass index is 38.58 kg/m.  General: Cooperative, alert, well developed, in no acute distress. HEENT: Conjunctivae and lids  unremarkable. Cardiovascular: Regular rhythm.  Lungs: Normal work of breathing. Neurologic: No focal deficits.   Lab Results  Component Value Date   CREATININE 0.72 04/11/2019   BUN 20 04/11/2019   NA 142 04/11/2019   K 3.8 04/11/2019   CL 107 04/11/2019   CO2 23 04/11/2019   Lab Results  Component Value Date   ALT 35 04/11/2019   AST 31 04/11/2019   ALKPHOS 62 04/11/2019   BILITOT 0.1 (L) 04/11/2019   Lab Results  Component Value Date   HGBA1C 5.5 10/15/2020   Lab Results  Component Value Date   INSULIN 14.2 10/15/2020   No results found for: TSH Lab Results  Component Value Date   TRIG 102 04/10/2019   Lab Results  Component Value Date   WBC 5.1 10/15/2020   HGB 13.5 10/15/2020   HCT 41.7 10/15/2020   MCV 83 10/15/2020   PLT 168 10/15/2020   Lab Results  Component Value Date   IRON 65 08/07/2020   TIBC 288 08/07/2020   FERRITIN 65 08/07/2020   Attestation Statements:   Reviewed by clinician on day of visit: allergies, medications, problem list, medical history, surgical history, family history, social history, and previous encounter notes.   08/09/2020, am acting as Trude Mcburney for Energy manager, DO.  I have reviewed the above documentation for accuracy and completeness, and I agree with the above. [employer], DO

## 2020-11-01 ENCOUNTER — Encounter (INDEPENDENT_AMBULATORY_CARE_PROVIDER_SITE_OTHER): Payer: Self-pay | Admitting: Bariatrics

## 2020-11-13 ENCOUNTER — Ambulatory Visit (INDEPENDENT_AMBULATORY_CARE_PROVIDER_SITE_OTHER): Payer: 59 | Admitting: Bariatrics

## 2020-11-13 ENCOUNTER — Other Ambulatory Visit: Payer: Self-pay

## 2020-11-13 VITALS — BP 142/86 | HR 57 | Temp 97.8°F | Ht 66.0 in | Wt 237.0 lb

## 2020-11-13 DIAGNOSIS — Z6838 Body mass index (BMI) 38.0-38.9, adult: Secondary | ICD-10-CM

## 2020-11-13 DIAGNOSIS — I10 Essential (primary) hypertension: Secondary | ICD-10-CM

## 2020-11-13 DIAGNOSIS — E559 Vitamin D deficiency, unspecified: Secondary | ICD-10-CM

## 2020-11-13 DIAGNOSIS — Z9189 Other specified personal risk factors, not elsewhere classified: Secondary | ICD-10-CM | POA: Diagnosis not present

## 2020-11-13 MED ORDER — VITAMIN D (ERGOCALCIFEROL) 1.25 MG (50000 UNIT) PO CAPS: 50000.0000 [IU] | ORAL_CAPSULE | ORAL | 0 refills | Status: DC

## 2020-11-15 NOTE — Progress Notes (Signed)
Chief Complaint:   OBESITY Charlene Pearson is here to discuss her progress with her obesity treatment plan along with follow-up of her obesity related diagnoses. Charlene Pearson is on the Category 2 Plan and states she is following her eating plan approximately 90% of the time. Charlene Pearson states she is walking for2 miles 2 times per week, and walking on the treadmill for 1 mile 2 times per week.   Today's visit was #: 3 Starting weight: 236 lbs Starting date: 10/15/2020 Today's weight: 237 lbs Today's date: 11/13/2020 Total lbs lost to date: 0 Total lbs lost since last in-office visit: 2  Interim History: Charlene Pearson is down 2 lbs since her last visit. She has more focus over the last few weeks. She is doing well with her water intake.  Subjective:   1. Vitamin D deficiency Charlene Pearson denies nausea, vomiting, or muscle weakness.  2. Essential hypertension Charlene Pearson's blood pressure is controlled.  3. At risk for heart disease Charlene Pearson is at a higher than average risk for cardiovascular disease due to obesity and hypertension.   Assessment/Plan:   1. Vitamin D deficiency Low Vitamin D level contributes to fatigue and are associated with obesity, breast, and colon cancer. We will refill prescription Vitamin D for 1 month. Charlene Pearson will follow-up for routine testing of Vitamin D, at least 2-3 times per year to avoid over-replacement.  - Vitamin D, Ergocalciferol, (DRISDOL) 1.25 MG (50000 UNIT) CAPS capsule; Take 1 capsule (50,000 Units total) by mouth every 7 (seven) days.  Dispense: 4 capsule; Refill: 0  2. Essential hypertension Charlene Pearson will continue her medications, and will continue working on healthy weight loss and exercise to improve blood pressure control. We will watch for signs of hypotension as she continues her lifestyle modifications.  3. At risk for heart disease Charlene Pearson was given approximately 15 minutes of coronary artery disease prevention counseling today. She is 60 y.o. female and has  risk factors for heart disease including obesity. We discussed intensive lifestyle modifications today with an emphasis on specific weight loss instructions and strategies.   Repetitive spaced learning was employed today to elicit superior memory formation and behavioral change.  4. Class 2 severe obesity due to excess calories with serious comorbidity and body mass index (BMI) of 38.0 to 38.9 in adult [facility]) Charlene Pearson is currently in the action stage of change. As such, her goal is to continue with weight loss efforts. She has agreed to the Category 2 Plan.   Intentional eating was discussed.  Exercise goals: As is.  Behavioral modification strategies: increasing lean protein intake, decreasing simple carbohydrates, increasing vegetables, increasing water intake, decreasing eating out, no skipping meals, meal planning and cooking strategies, keeping healthy foods in the home and planning for success.  Charlene Pearson has agreed to follow-up with our clinic in 2 weeks. She was informed of the importance of frequent follow-up visits to maximize her success with intensive lifestyle modifications for her multiple health conditions.   Objective:   Blood pressure (!) 142/86, pulse (!) 57, temperature 97.8 F (36.6 C), height 5\' 6"  (1.676 m), SpO2 98 %. Body mass index is 38.58 kg/m.  General: Cooperative, alert, well developed, in no acute distress. HEENT: Conjunctivae and lids unremarkable. Cardiovascular: Regular rhythm.  Lungs: Normal work of breathing. Neurologic: No focal deficits.   Lab Results  Component Value Date   CREATININE 0.72 04/11/2019   BUN 20 04/11/2019   NA 142 04/11/2019   K 3.8 04/11/2019   CL 107 04/11/2019   CO2 23  04/11/2019   Lab Results  Component Value Date   ALT 35 04/11/2019   AST 31 04/11/2019   ALKPHOS 62 04/11/2019   BILITOT 0.1 (L) 04/11/2019   Lab Results  Component Value Date   HGBA1C 5.5 10/15/2020   Lab Results  Component Value Date   INSULIN 14.2  10/15/2020   No results found for: TSH Lab Results  Component Value Date   TRIG 102 04/10/2019   Lab Results  Component Value Date   WBC 5.1 10/15/2020   HGB 13.5 10/15/2020   HCT 41.7 10/15/2020   MCV 83 10/15/2020   PLT 168 10/15/2020   Lab Results  Component Value Date   IRON 65 08/07/2020   TIBC 288 08/07/2020   FERRITIN 65 08/07/2020   Attestation Statements:   Reviewed by clinician on day of visit: allergies, medications, problem list, medical history, surgical history, family history, social history, and previous encounter notes.   Trude Mcburney, am acting as Energy manager for [employer], DO.  I have reviewed the above documentation for accuracy and completeness, and I agree with the above. Corinna Capra, DO

## 2020-11-19 ENCOUNTER — Ambulatory Visit
Admission: RE | Admit: 2020-11-19 | Discharge: 2020-11-19 | Disposition: A | Discharge status: Home / Self Care | Payer: 59 | Source: Ambulatory Visit | Attending: Internal Medicine | Admitting: Internal Medicine

## 2020-11-19 ENCOUNTER — Other Ambulatory Visit: Payer: Self-pay

## 2020-11-19 ENCOUNTER — Encounter (INDEPENDENT_AMBULATORY_CARE_PROVIDER_SITE_OTHER): Payer: Self-pay | Admitting: Bariatrics

## 2020-11-19 DIAGNOSIS — Z1231 Encounter for screening mammogram for malignant neoplasm of breast: Secondary | ICD-10-CM

## 2020-11-21 ENCOUNTER — Telehealth: Payer: Self-pay | Admitting: Nurse Practitioner

## 2020-11-21 NOTE — Telephone Encounter (Signed)
Moved upcoming appointment due to provider's template. Patient is aware of changes. 

## 2020-11-26 ENCOUNTER — Ambulatory Visit (INDEPENDENT_AMBULATORY_CARE_PROVIDER_SITE_OTHER): Payer: 59 | Admitting: Bariatrics

## 2020-11-26 ENCOUNTER — Other Ambulatory Visit: Payer: Self-pay

## 2020-11-26 VITALS — BP 133/82 | HR 62 | Temp 98.3°F | Ht 66.0 in | Wt 236.0 lb

## 2020-11-26 DIAGNOSIS — E559 Vitamin D deficiency, unspecified: Secondary | ICD-10-CM

## 2020-11-26 DIAGNOSIS — I10 Essential (primary) hypertension: Secondary | ICD-10-CM | POA: Diagnosis not present

## 2020-11-26 DIAGNOSIS — Z6838 Body mass index (BMI) 38.0-38.9, adult: Secondary | ICD-10-CM | POA: Diagnosis not present

## 2020-11-26 DIAGNOSIS — Z9189 Other specified personal risk factors, not elsewhere classified: Secondary | ICD-10-CM

## 2020-11-26 MED ORDER — VITAMIN D (ERGOCALCIFEROL) 1.25 MG (50000 UNIT) PO CAPS: 50000.0000 [IU] | ORAL_CAPSULE | ORAL | 0 refills | Status: DC

## 2020-11-27 NOTE — Progress Notes (Unsigned)
Chief Complaint:   OBESITY Charlene Pearson is here to discuss her progress with her obesity treatment plan along with follow-up of her obesity related diagnoses. Charlene Pearson is on the Category 2 Plan and states Charlene Pearson is following her eating plan approximately 90% of the time. Charlene Pearson states Charlene Pearson is walking 2 miles 3 times per week.   Today's visit was #: 4 Starting weight: 236 lbs Starting date: 10/15/2020 Today's weight: 236 lbs Today's date: 11/26/2020 Total lbs lost to date: 0 Total lbs lost since last in-office visit: 1  Interim History: Charlene Pearson is down 1 lb and Charlene Pearson is doing well overall. Charlene Pearson cheats with a "Dewey cookie" 120 calories.  Subjective:   1. Vitamin D deficiency Charlene Pearson's last Vit D level was 28.7. Charlene Pearson notes minimal sun exposure.  2. Essential hypertension Charlene Pearson is taking Diovan-HCT. Her blood pressure is controlled.  3. At risk for osteoporosis Charlene Pearson is at higher risk of osteopenia and osteoporosis due to Vitamin D deficiency.   Assessment/Plan:   1. Vitamin D deficiency Low Vitamin D level contributes to fatigue and are associated with obesity, breast, and colon cancer. We will refill prescription Vitamin D for 1 month. Charlene Pearson will follow-up for routine testing of Vitamin D, at least 2-3 times per year to avoid over-replacement.  - Vitamin D, Ergocalciferol, (DRISDOL) 1.25 MG (50000 UNIT) CAPS capsule; Take 1 capsule (50,000 Units total) by mouth every 7 (seven) days.  Dispense: 4 capsule; Refill: 0  2. Essential hypertension Charlene Pearson will continue her medications, and will continue working on healthy weight loss and exercise to improve blood pressure control. We will watch for signs of hypotension as Charlene Pearson continues her lifestyle modifications.  3. At risk for osteoporosis Charlene Pearson was given approximately 15 minutes of osteoporosis prevention counseling today. Charlene Pearson is at risk for osteopenia and osteoporosis due to her Vitamin D deficiency. Charlene Pearson was encouraged to take  her Vitamin D and follow her higher calcium diet and increase strengthening exercise to help strengthen her bones and decrease her risk of osteopenia and osteoporosis.  Repetitive spaced learning was employed today to elicit superior memory formation and behavioral change.  4. Class 2 severe obesity due to excess calories with serious comorbidity and body mass index (BMI) of 38.0 to 38.9 in adult Charlene Pearson) Charlene Pearson is currently in the action stage of change. As such, her goal is to continue with weight loss efforts. Charlene Pearson has agreed to the Category 2 Plan.   Mindful eating was discussed. Charlene Pearson is to increase her protein and weigh her meat. Charlene Pearson is to increase her water to 64-80 oz daily.  Exercise goals: As is.  Behavioral modification strategies: increasing lean protein intake, decreasing simple carbohydrates, increasing vegetables, increasing water intake, decreasing eating out, no skipping meals, meal planning and cooking strategies, keeping healthy foods in the home and planning for success.  Charlene Pearson has agreed to follow-up with our clinic in 2 weeks. Charlene Pearson was informed of the importance of frequent follow-up visits to maximize her success with intensive lifestyle modifications for her multiple health conditions.   Objective:   Blood pressure 133/82, pulse 62, temperature 98.3 F (36.8 C), height 5\' 6"  (1.676 m), weight 236 lb (107 kg), SpO2 96 %. Body mass index is 38.09 kg/m.  General: Cooperative, alert, well developed, in no acute distress. HEENT: Conjunctivae and lids unremarkable. Cardiovascular: Regular rhythm.  Lungs: Normal work of breathing. Neurologic: No focal deficits.   Lab Results  Component Value Date   CREATININE 0.72 04/11/2019   BUN  20 04/11/2019   NA 142 04/11/2019   K 3.8 04/11/2019   CL 107 04/11/2019   CO2 23 04/11/2019   Lab Results  Component Value Date   ALT 35 04/11/2019   AST 31 04/11/2019   ALKPHOS 62 04/11/2019   BILITOT 0.1 (L) 04/11/2019   Lab  Results  Component Value Date   HGBA1C 5.5 10/15/2020   Lab Results  Component Value Date   INSULIN 14.2 10/15/2020   No results found for: TSH Lab Results  Component Value Date   TRIG 102 04/10/2019   Lab Results  Component Value Date   WBC 5.1 10/15/2020   HGB 13.5 10/15/2020   HCT 41.7 10/15/2020   MCV 83 10/15/2020   PLT 168 10/15/2020   Lab Results  Component Value Date   IRON 65 08/07/2020   TIBC 288 08/07/2020   FERRITIN 65 08/07/2020   Attestation Statements:   Reviewed by clinician on day of visit: allergies, medications, problem list, medical history, surgical history, family history, social history, and previous encounter notes.   Trude Mcburney, am acting as Energy manager for [employer], DO.  I have reviewed the above documentation for accuracy and completeness, and I agree with the above. Corinna Capra, DO

## 2020-11-28 ENCOUNTER — Encounter (INDEPENDENT_AMBULATORY_CARE_PROVIDER_SITE_OTHER): Payer: Self-pay | Admitting: Bariatrics

## 2020-12-05 ENCOUNTER — Other Ambulatory Visit: Payer: Self-pay

## 2020-12-05 ENCOUNTER — Inpatient Hospital Stay: Payer: 59 | Attending: Nurse Practitioner

## 2020-12-05 DIAGNOSIS — D649 Anemia, unspecified: Secondary | ICD-10-CM

## 2020-12-05 DIAGNOSIS — K909 Intestinal malabsorption, unspecified: Secondary | ICD-10-CM | POA: Diagnosis not present

## 2020-12-05 DIAGNOSIS — D508 Other iron deficiency anemias: Secondary | ICD-10-CM | POA: Insufficient documentation

## 2020-12-05 DIAGNOSIS — Z79899 Other long term (current) drug therapy: Secondary | ICD-10-CM | POA: Insufficient documentation

## 2020-12-05 DIAGNOSIS — Z8616 Personal history of COVID-19: Secondary | ICD-10-CM | POA: Diagnosis not present

## 2020-12-05 DIAGNOSIS — I10 Essential (primary) hypertension: Secondary | ICD-10-CM | POA: Insufficient documentation

## 2020-12-05 LAB — CBC WITH DIFFERENTIAL (CANCER CENTER ONLY)
Abs Immature Granulocytes: 0.02 10*3/uL (ref 0.00–0.07)
Basophils Absolute: 0.1 10*3/uL (ref 0.0–0.1)
Basophils Relative: 1 %
Eosinophils Absolute: 0.1 10*3/uL (ref 0.0–0.5)
Eosinophils Relative: 2 %
HCT: 38.5 % (ref 36.0–46.0)
Hemoglobin: 12.6 g/dL (ref 12.0–15.0)
Immature Granulocytes: 0 %
Lymphocytes Relative: 38 %
Lymphs Abs: 2.2 10*3/uL (ref 0.7–4.0)
MCH: 27.1 pg (ref 26.0–34.0)
MCHC: 32.7 g/dL (ref 30.0–36.0)
MCV: 82.8 fL (ref 80.0–100.0)
Monocytes Absolute: 0.4 10*3/uL (ref 0.1–1.0)
Monocytes Relative: 7 %
Neutro Abs: 3 10*3/uL (ref 1.7–7.7)
Neutrophils Relative %: 52 %
Platelet Count: 233 10*3/uL (ref 150–400)
RBC: 4.65 MIL/uL (ref 3.87–5.11)
RDW: 14.3 % (ref 11.5–15.5)
WBC Count: 5.8 10*3/uL (ref 4.0–10.5)
nRBC: 0 % (ref 0.0–0.2)

## 2020-12-05 LAB — RETIC PANEL
Immature Retic Fract: 17.4 % — ABNORMAL HIGH (ref 2.3–15.9)
RBC.: 4.63 MIL/uL (ref 3.87–5.11)
Retic Count, Absolute: 52.3 10*3/uL (ref 19.0–186.0)
Retic Ct Pct: 1.1 % (ref 0.4–3.1)
Reticulocyte Hemoglobin: 31.1 pg (ref >27.9)

## 2020-12-05 LAB — FERRITIN: Ferritin: 63 ng/mL (ref 11–307)

## 2020-12-05 LAB — IRON AND TIBC
Iron: 69 ug/dL (ref 41–142)
Saturation Ratios: 25 % (ref 21–57)
TIBC: 278 ug/dL (ref 236–444)
UIBC: 208 ug/dL (ref 120–384)

## 2020-12-10 ENCOUNTER — Other Ambulatory Visit: Payer: Self-pay

## 2020-12-10 ENCOUNTER — Encounter (INDEPENDENT_AMBULATORY_CARE_PROVIDER_SITE_OTHER): Payer: Self-pay | Admitting: Bariatrics

## 2020-12-10 ENCOUNTER — Ambulatory Visit (INDEPENDENT_AMBULATORY_CARE_PROVIDER_SITE_OTHER): Payer: 59 | Admitting: Bariatrics

## 2020-12-10 VITALS — BP 121/81 | HR 66 | Temp 98.3°F | Ht 66.0 in | Wt 236.0 lb

## 2020-12-10 DIAGNOSIS — Z6838 Body mass index (BMI) 38.0-38.9, adult: Secondary | ICD-10-CM

## 2020-12-10 DIAGNOSIS — I10 Essential (primary) hypertension: Secondary | ICD-10-CM | POA: Diagnosis not present

## 2020-12-10 DIAGNOSIS — E559 Vitamin D deficiency, unspecified: Secondary | ICD-10-CM

## 2020-12-12 NOTE — Progress Notes (Unsigned)
Chief Complaint:   OBESITY Charlene Pearson is here to discuss her progress with her obesity treatment plan along with follow-up of her obesity related diagnoses. Charlene Pearson is on the Category 2 Plan and states she is following her eating plan approximately 80% of the time. Charlene Pearson states she is walking 2 miles 3 times per week.   Today's visit was #: 5 Starting weight: 236 lbs Starting date: 10/15/2020 Today's weight: 237 lbs Today's date: 12/10/2020 Total lbs lost to date: 0 Total lbs lost since last in-office visit: 0  Interim History: Lowella's weight remains the same. She states that she did have a prednisone for bronchitis. She is doing better with her.  Subjective:   1. Essential hypertension Charlene Pearson's blood pressure is controlled. She is taking Diovan HCT.  2. Vitamin D deficiency Charlene Pearson is taking Vit D, and she denies side effects.  Assessment/Plan:   1. Essential hypertension Charlene Pearson will continue her medications, and will continue working on healthy weight loss and exercise to improve blood pressure control. We will watch for signs of hypotension as she continues her lifestyle modifications.  2. Vitamin D deficiency Low Vitamin D level contributes to fatigue and are associated with obesity, breast, and colon cancer. Charlene Pearson agreed to continue taking prescription Vitamin D 50,000 IU every week and will follow-up for routine testing of Vitamin D, at least 2-3 times per year to avoid over-replacement.  3. Class 2 severe obesity due to excess calories with serious comorbidity and body mass index (BMI) of 38.0 to 38.9 in adult [facility]) Charlene Pearson is currently in the action stage of change. As such, her goal is to continue with weight loss efforts. She has agreed to the Category 2 Plan.   Charlene Pearson will start adhering to the meal plan. Mindful eating was discussed.  Exercise goals: As is, and will do some stretches.  Behavioral modification strategies: increasing lean protein intake,  decreasing simple carbohydrates, increasing vegetables, increasing water intake, decreasing eating out, no skipping meals, meal planning and cooking strategies, keeping healthy foods in the home and planning for success.  Charlene Pearson has agreed to follow-up with our clinic in 2 weeks. She was informed of the importance of frequent follow-up visits to maximize her success with intensive lifestyle modifications for her multiple health conditions.   Objective:   Blood pressure 121/81, pulse 66, temperature 98.3 F (36.8 C), height 5\' 6"  (1.676 m), weight 236 lb (107 kg), SpO2 96 %. Body mass index is 38.09 kg/m.  General: Cooperative, alert, well developed, in no acute distress. HEENT: Conjunctivae and lids unremarkable. Cardiovascular: Regular rhythm.  Lungs: Normal work of breathing. Neurologic: No focal deficits.   Lab Results  Component Value Date   CREATININE 0.72 04/11/2019   BUN 20 04/11/2019   NA 142 04/11/2019   K 3.8 04/11/2019   CL 107 04/11/2019   CO2 23 04/11/2019   Lab Results  Component Value Date   ALT 35 04/11/2019   AST 31 04/11/2019   ALKPHOS 62 04/11/2019   BILITOT 0.1 (L) 04/11/2019   Lab Results  Component Value Date   HGBA1C 5.5 10/15/2020   Lab Results  Component Value Date   INSULIN 14.2 10/15/2020   No results found for: TSH Lab Results  Component Value Date   TRIG 102 04/10/2019   Lab Results  Component Value Date   WBC 5.8 12/05/2020   HGB 12.6 12/05/2020   HCT 38.5 12/05/2020   MCV 82.8 12/05/2020   PLT 233 12/05/2020   Lab  Results  Component Value Date   IRON 69 12/05/2020   TIBC 278 12/05/2020   FERRITIN 63 12/05/2020   Attestation Statements:   Reviewed by clinician on day of visit: allergies, medications, problem list, medical history, surgical history, family history, social history, and previous encounter notes.  Time spent on visit including pre-visit chart review and post-visit care and charting was 20 minutes.    Trude Mcburney, am acting as Energy manager for [employer], DO.  I have reviewed the above documentation for accuracy and completeness, and I agree with the above. Corinna Capra, DO

## 2020-12-13 ENCOUNTER — Other Ambulatory Visit (INDEPENDENT_AMBULATORY_CARE_PROVIDER_SITE_OTHER): Payer: Self-pay | Admitting: Bariatrics

## 2020-12-13 ENCOUNTER — Encounter (INDEPENDENT_AMBULATORY_CARE_PROVIDER_SITE_OTHER): Payer: Self-pay | Admitting: Bariatrics

## 2020-12-13 DIAGNOSIS — E559 Vitamin D deficiency, unspecified: Secondary | ICD-10-CM

## 2020-12-24 ENCOUNTER — Other Ambulatory Visit: Payer: Self-pay

## 2020-12-24 ENCOUNTER — Ambulatory Visit (INDEPENDENT_AMBULATORY_CARE_PROVIDER_SITE_OTHER): Payer: 59 | Admitting: Bariatrics

## 2020-12-24 VITALS — BP 145/86 | HR 70 | Temp 98.0°F | Ht 66.0 in | Wt 239.0 lb

## 2020-12-24 DIAGNOSIS — E559 Vitamin D deficiency, unspecified: Secondary | ICD-10-CM | POA: Diagnosis not present

## 2020-12-24 DIAGNOSIS — F5089 Other specified eating disorder: Secondary | ICD-10-CM | POA: Diagnosis not present

## 2020-12-24 DIAGNOSIS — I10 Essential (primary) hypertension: Secondary | ICD-10-CM

## 2020-12-24 DIAGNOSIS — Z9189 Other specified personal risk factors, not elsewhere classified: Secondary | ICD-10-CM

## 2020-12-24 DIAGNOSIS — Z6838 Body mass index (BMI) 38.0-38.9, adult: Secondary | ICD-10-CM

## 2020-12-24 MED ORDER — BUPROPION HCL ER (SR) 150 MG PO TB12: 150.0000 mg | ORAL_TABLET | Freq: Every day | ORAL | 0 refills | Status: DC

## 2020-12-31 NOTE — Progress Notes (Signed)
Chief Complaint:   OBESITY Charlene Pearson is here to discuss her progress with her obesity treatment plan along with follow-up of her obesity related diagnoses. Charlene Pearson is on the Category 2 Plan and states she is following her eating plan approximately 75% of the time. Charlene Pearson states she is not exercising at this time.  Today's visit was #: 6 Starting weight: 236 lbs Starting date: 10/15/2020 Today's weight: 239 lbs Today's date: 12/24/2020 Total lbs lost to date: 0 Total lbs lost since last in-office visit: 0  Interim History: Charlene Pearson is up 3 pounds since her last visit.  She went to a conference and has had more stress (death of a loved one).  Subjective:   1. Vitamin D deficiency Charlene Pearson Vitamin D level was 28.7 on 10/15/2020. She is currently taking prescription vitamin D 50,000 IU each week. She denies nausea, vomiting or muscle weakness.  2. Essential hypertension Reasonably well controlled.  Review: taking medications as instructed, no medication side effects noted, no chest pain on exertion, no dyspnea on exertion, no swelling of ankles.  Taking Diovan-HCT.  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  4. At risk for heart disease Charlene Pearson is at a higher than average risk for cardiovascular disease due to obesity and hypertension.  Assessment/Plan:   1. Vitamin D deficiency Low Vitamin D level contributes to fatigue and are associated with obesity, breast, and colon cancer. She agrees to continue to take prescription Vitamin D @50 ,000 IU every week and will follow-up for routine testing of Vitamin D, at least 2-3 times per year to avoid over-replacement.  2. Essential hypertension Charlene Pearson is working on healthy weight loss and exercise to improve blood pressure control. We will watch for signs of hypotension as she continues her lifestyle modifications.  Continue  medication.  3. Other disorder of eating Charlene Pearson will start Wellbutrin 150 mg daily.  Prescription sent to pharmacy today.  4. At risk for heart disease Charlene Pearson was given approximately 15 minutes of coronary artery disease prevention counseling today. She is 60 y.o. female and has risk factors for heart disease including obesity. We discussed intensive lifestyle modifications today with an emphasis on specific weight loss instructions and strategies.   Repetitive spaced learning was employed today to elicit superior memory formation and behavioral change.  5. Obesity, current BMI 38  Charlene Pearson is currently in the action stage of change. As such, her goal is to continue with weight loss efforts. She has agreed to the Category 2 Plan.   She will work on adhering closely to the plan, meal planning, increasing water intake, and continuing to increase protein intake.  Exercise goals: Increase exercise and walking.  Behavioral modification strategies: increasing lean protein intake, decreasing simple carbohydrates, increasing vegetables, increasing water intake, decreasing eating out, no skipping meals, meal planning and cooking strategies, keeping healthy foods in the home and planning for success.  Charlene Pearson has agreed to follow-up with our clinic in 2 weeks. She was informed of the importance of frequent follow-up visits to maximize her success with intensive lifestyle modifications for her multiple health conditions.   Objective:   Blood pressure (!) 145/86, pulse 70, temperature 98 F (36.7 C), height 5\' 6"  (1.676 m), weight 239 lb (108.4 kg), SpO2 100 %. Body mass index is 38.58 kg/m.  General: Cooperative, alert, well developed, in no acute distress. HEENT: Conjunctivae and lids unremarkable. Cardiovascular: Regular  rhythm.  Lungs: Normal work of breathing. Neurologic: No focal deficits.   Lab Results  Component Value Date   CREATININE 0.72 04/11/2019   BUN 20 04/11/2019   NA 142  04/11/2019   K 3.8 04/11/2019   CL 107 04/11/2019   CO2 23 04/11/2019   Lab Results  Component Value Date   ALT 35 04/11/2019   AST 31 04/11/2019   ALKPHOS 62 04/11/2019   BILITOT 0.1 (L) 04/11/2019   Lab Results  Component Value Date   HGBA1C 5.5 10/15/2020   Lab Results  Component Value Date   INSULIN 14.2 10/15/2020   Lab Results  Component Value Date   TRIG 102 04/10/2019   Lab Results  Component Value Date   WBC 5.8 12/05/2020   HGB 12.6 12/05/2020   HCT 38.5 12/05/2020   MCV 82.8 12/05/2020   PLT 233 12/05/2020   Lab Results  Component Value Date   IRON 69 12/05/2020   TIBC 278 12/05/2020   FERRITIN 63 12/05/2020   Attestation Statements:   Reviewed by clinician on day of visit: allergies, medications, problem list, medical history, surgical history, family history, social history, and previous encounter notes.  I, Insurance claims handler, CMA, am acting as Energy manager for [employer], DO  I have reviewed the above documentation for accuracy and completeness, and I agree with the above. Corinna Capra, DO

## 2021-01-10 ENCOUNTER — Ambulatory Visit (INDEPENDENT_AMBULATORY_CARE_PROVIDER_SITE_OTHER): Payer: 59 | Admitting: Bariatrics

## 2021-01-22 ENCOUNTER — Other Ambulatory Visit (INDEPENDENT_AMBULATORY_CARE_PROVIDER_SITE_OTHER): Payer: Self-pay | Admitting: Bariatrics

## 2021-01-22 DIAGNOSIS — F5089 Other specified eating disorder: Secondary | ICD-10-CM

## 2021-01-22 NOTE — Telephone Encounter (Signed)
Dr.Brown 

## 2021-01-22 NOTE — Telephone Encounter (Signed)
Refill request

## 2021-01-28 ENCOUNTER — Ambulatory Visit (INDEPENDENT_AMBULATORY_CARE_PROVIDER_SITE_OTHER): Payer: 59 | Admitting: Bariatrics

## 2021-01-29 ENCOUNTER — Ambulatory Visit (INDEPENDENT_AMBULATORY_CARE_PROVIDER_SITE_OTHER): Payer: 59 | Admitting: Bariatrics

## 2021-02-07 ENCOUNTER — Ambulatory Visit: Payer: 59 | Attending: Critical Care Medicine

## 2021-02-07 DIAGNOSIS — Z20822 Contact with and (suspected) exposure to covid-19: Secondary | ICD-10-CM

## 2021-02-08 LAB — SARS-COV-2, NAA 2 DAY TAT

## 2021-02-08 LAB — NOVEL CORONAVIRUS, NAA: SARS-CoV-2, NAA: NOT DETECTED

## 2021-02-11 ENCOUNTER — Other Ambulatory Visit: Payer: Self-pay

## 2021-02-11 ENCOUNTER — Encounter (INDEPENDENT_AMBULATORY_CARE_PROVIDER_SITE_OTHER): Payer: Self-pay | Admitting: Bariatrics

## 2021-02-11 ENCOUNTER — Ambulatory Visit (INDEPENDENT_AMBULATORY_CARE_PROVIDER_SITE_OTHER): Payer: 59 | Admitting: Bariatrics

## 2021-02-11 VITALS — BP 144/81 | HR 65 | Temp 97.8°F | Ht 66.0 in | Wt 241.0 lb

## 2021-02-11 DIAGNOSIS — E8881 Metabolic syndrome: Secondary | ICD-10-CM | POA: Diagnosis not present

## 2021-02-11 DIAGNOSIS — E559 Vitamin D deficiency, unspecified: Secondary | ICD-10-CM | POA: Diagnosis not present

## 2021-02-11 DIAGNOSIS — F3289 Other specified depressive episodes: Secondary | ICD-10-CM | POA: Diagnosis not present

## 2021-02-11 DIAGNOSIS — Z6838 Body mass index (BMI) 38.0-38.9, adult: Secondary | ICD-10-CM

## 2021-02-11 MED ORDER — VITAMIN D (ERGOCALCIFEROL) 1.25 MG (50000 UNIT) PO CAPS: 50000.0000 [IU] | ORAL_CAPSULE | ORAL | 0 refills | Status: AC

## 2021-02-12 ENCOUNTER — Encounter (INDEPENDENT_AMBULATORY_CARE_PROVIDER_SITE_OTHER): Payer: Self-pay | Admitting: Bariatrics

## 2021-02-12 NOTE — Progress Notes (Signed)
Chief Complaint:   OBESITY Charlene Pearson is here to discuss her progress with her obesity treatment plan along with follow-up of her obesity related diagnoses. Jakeline is on the Category 2 Plan and states she is following her eating plan approximately 50% of the time. Arien states she is walking 2 miles 2 times per week.  Today's visit was #: 7 Starting weight: 236 lbs Starting date: 10/15/2020 Today's weight: 241 lbs Today's date: 02/11/2021 Total lbs lost to date: 0 Total lbs lost since last in-office visit: 0  Interim History: Charlene Pearson is up 2 lbs since her last visit. She has been out of town and not eating on plan.  Subjective:   1. Vitamin D deficiency Charlene Pearson denies nausea, vomiting, and muscle weakness.  2. Insulin resistance Charlene Pearson is not on medication.  3. Other depression, with emotional eating Charlene Pearson was started on Wellbutrin but discontinued the medication.  Assessment/Plan:   1. Vitamin D deficiency Low Vitamin D level contributes to fatigue and are associated with obesity, breast, and colon cancer. She agrees to continue to take prescription Vitamin D @50 ,000 IU every week and will follow-up for routine testing of Vitamin D, at least 2-3 times per year to avoid over-replacement. - Vitamin D, Ergocalciferol, (DRISDOL) 1.25 MG (50000 UNIT) CAPS capsule; Take 1 capsule (50,000 Units total) by mouth every 7 (seven) days.  Dispense: 4 capsule; Refill: 0  2. Insulin resistance Charlene Pearson will continue to work on weight loss, exercise, and decreasing simple carbohydrates to help decrease the risk of diabetes. Charlene Pearson agreed to follow-up with [name] as directed to closely monitor her progress. -Increase healthy fats and protein.  3. Other depression, with emotional eating Behavior modification techniques were discussed today to help Charlene Pearson deal with her emotional/non-hunger eating behaviors.  Orders and follow up as documented in patient record.  -Pt will not resume  Wellbutrin.  4. Obesity, current BMI 38 Charlene Pearson is currently in the action stage of change. As such, her goal is to continue with weight loss efforts. She has agreed to the Category 2 Plan.   Meal plan Intentional eating Increase raw vegetables  Exercise goals: As is  Behavioral modification strategies: increasing lean protein intake, decreasing simple carbohydrates, increasing vegetables, increasing water intake, decreasing eating out, no skipping meals, meal planning and cooking strategies, keeping healthy foods in the home and planning for success.  Charlene Pearson has agreed to follow-up with our clinic in 2 weeks. She was informed of the importance of frequent follow-up visits to maximize her success with intensive lifestyle modifications for her multiple health conditions.   Objective:   Blood pressure (!) 144/81, pulse 65, temperature 97.8 F (36.6 C), height 5\' 6"  (1.676 m), weight 241 lb (109.3 kg), SpO2 99 %. Body mass index is 38.9 kg/m.  General: Cooperative, alert, well developed, in no acute distress. HEENT: Conjunctivae and lids unremarkable. Cardiovascular: Regular rhythm.  Lungs: Normal work of breathing. Neurologic: No focal deficits.   Lab Results  Component Value Date   CREATININE 0.72 04/11/2019   BUN 20 04/11/2019   NA 142 04/11/2019   K 3.8 04/11/2019   CL 107 04/11/2019   CO2 23 04/11/2019   Lab Results  Component Value Date   ALT 35 04/11/2019   AST 31 04/11/2019   ALKPHOS 62 04/11/2019   BILITOT 0.1 (L) 04/11/2019   Lab Results  Component Value Date   HGBA1C 5.5 10/15/2020   Lab Results  Component Value Date   INSULIN 14.2 10/15/2020   No  results found for: TSH Lab Results  Component Value Date   TRIG 102 04/10/2019   Lab Results  Component Value Date   WBC 5.8 12/05/2020   HGB 12.6 12/05/2020   HCT 38.5 12/05/2020   MCV 82.8 12/05/2020   PLT 233 12/05/2020   Lab Results  Component Value Date   IRON 69 12/05/2020   TIBC 278  12/05/2020   FERRITIN 63 12/05/2020    Attestation Statements:   Reviewed by clinician on day of visit: allergies, medications, problem list, medical history, surgical history, family history, social history, and previous encounter notes.  Edmund Hilda, CMA, am acting as Energy manager for [employer], DO.  I have reviewed the above documentation for accuracy and completeness, and I agree with the above. Corinna Capra, DO

## 2021-03-04 ENCOUNTER — Ambulatory Visit (INDEPENDENT_AMBULATORY_CARE_PROVIDER_SITE_OTHER): Payer: 59 | Admitting: Bariatrics

## 2021-04-08 ENCOUNTER — Inpatient Hospital Stay: Payer: 59

## 2021-04-08 ENCOUNTER — Inpatient Hospital Stay: Payer: 59 | Admitting: Nurse Practitioner

## 2021-04-08 ENCOUNTER — Other Ambulatory Visit: Payer: Self-pay | Admitting: Nurse Practitioner

## 2021-04-08 ENCOUNTER — Telehealth: Payer: Self-pay | Admitting: Nurse Practitioner

## 2021-04-08 ENCOUNTER — Telehealth: Payer: Self-pay

## 2021-04-08 DIAGNOSIS — D509 Iron deficiency anemia, unspecified: Secondary | ICD-10-CM

## 2021-04-08 NOTE — Telephone Encounter (Signed)
R/s appts per 7/18 sch msg. Pt aware.

## 2021-04-08 NOTE — Progress Notes (Deleted)
[facility]   [phone] [phone]   Clinic Follow up Note   Patient Care Team: Georgann Housekeeper, MD as PCP - General (Internal Medicine) 04/08/2021  CHIEF COMPLAINT: Follow up anemia   CURRENT THERAPY: IV Venofer 200 mg PRN  INTERVAL HISTORY: Ms. Charlene Pearson returns for follow up as scheduled. Last IV Venofer in 04/2020. She was last seen by me 08/07/20.    REVIEW OF SYSTEMS:   Constitutional: Denies fevers, chills or abnormal weight loss Eyes: Denies blurriness of vision Ears, nose, mouth, throat, and face: Denies mucositis or sore throat Respiratory: Denies cough, dyspnea or wheezes Cardiovascular: Denies palpitation, chest discomfort or lower extremity swelling Gastrointestinal:  Denies nausea, heartburn or change in bowel habits Skin: Denies abnormal skin rashes Lymphatics: Denies new lymphadenopathy or easy bruising Neurological:Denies numbness, tingling or new weaknesses Behavioral/Psych: Mood is stable, no new changes  All other systems were reviewed with the patient and are negative.  MEDICAL HISTORY:  Past Medical History:  Diagnosis Date   Asthma    Hypertension    Varicose veins     SURGICAL HISTORY: Past Surgical History:  Procedure Laterality Date   ABDOMINAL HYSTERECTOMY     ROUX-EN-Y PROCEDURE  2009    I have reviewed the social history and family history with the patient and they are unchanged from previous note.  ALLERGIES:  is allergic to amoxicillin and latex.  MEDICATIONS:  Current Outpatient Medications  Medication Sig Dispense Refill   buPROPion (WELLBUTRIN SR) 150 MG 12 hr tablet TAKE 1 TABLET BY MOUTH EVERY DAY 30 tablet 0   ferrous sulfate 325 (65 FE) MG tablet Take 325 mg by mouth daily with breakfast.     Loratadine 10 MG CAPS Take by mouth.     Multiple Vitamins-Minerals (CENTRUM ADULTS PO) Take by mouth.     valsartan-hydrochlorothiazide (DIOVAN-HCT) 160-12.5 MG per tablet Take 1 tablet by mouth daily.      Vitamin D, Ergocalciferol, (DRISDOL) 1.25 MG (50000 UNIT) CAPS capsule Take 1 capsule (50,000 Units total) by mouth every 7 (seven) days. 4 capsule 0   No current facility-administered medications for this visit.    PHYSICAL EXAMINATION: ECOG PERFORMANCE STATUS: {CHL ONC ECOG PS:[id number]}  There were no vitals filed for this visit. There were no vitals filed for this visit.  GENERAL:alert, no distress and comfortable SKIN: skin color, texture, turgor are normal, no rashes or significant lesions EYES: normal, Conjunctiva are pink and non-injected, sclera clear OROPHARYNX:no exudate, no erythema and lips, buccal mucosa, and tongue normal  NECK: supple, thyroid normal size, non-tender, without nodularity LYMPH:  no palpable lymphadenopathy in the cervical, axillary or inguinal LUNGS: clear to auscultation and percussion with normal breathing effort HEART: regular rate & rhythm and no murmurs and no lower extremity edema ABDOMEN:abdomen soft, non-tender and normal bowel sounds Musculoskeletal:no cyanosis of digits and no clubbing  NEURO: alert & oriented x 3 with fluent speech, no focal motor/sensory deficits  LABORATORY DATA:  I have reviewed the data as listed CBC Latest Ref Rng & Units 12/05/2020 10/15/2020 08/07/2020  WBC 4.0 - 10.5 K/uL 5.8 5.1 5.9  Hemoglobin 12.0 - 15.0 g/dL 41.6 60.6 30.1  Hematocrit 36.0 - 46.0 % 38.5 41.7 37.6  Platelets 150 - 400 K/uL 233 168 237     CMP Latest Ref Rng & Units 04/11/2019 04/10/2019 04/09/2019  Glucose 70 - 99 mg/dL 76 85 601(U)  BUN 6 - 20 mg/dL 20 20 93(A)  Creatinine 0.44 - 1.00 mg/dL 3.55 7.32  0.72  Sodium 135 - 145 mmol/L 142 143 141  Potassium 3.5 - 5.1 mmol/L 3.8 3.4(L) 3.6  Chloride 98 - 111 mmol/L 107 107 104  CO2 22 - 32 mmol/L 23 27 26   Calcium 8.9 - 10.3 mg/dL ) 2.9(H) 3.7(J)  Total Protein 6.5 - 8.1 g/dL 6.6 6.7 7.0  Total Bilirubin 0.3 - 1.2 mg/dL 6.9(C) <0.1(L) 0.1(L)  Alkaline Phos 38 - 126 U/L 62 64 60  AST  15 - 41 U/L 31 44(H) 62(H)  ALT 0 - 44 U/L 35 39 41      RADIOGRAPHIC STUDIES: I have personally reviewed the radiological images as listed and agreed with the findings in the report. No results found.   ASSESSMENT & PLAN:  No problem-specific Assessment & Plan notes found for this encounter.   No orders of the defined types were placed in this encounter.  All questions were answered. The patient knows to call the clinic with any problems, questions or concerns. No barriers to learning was detected. I spent {CHL ONC TIME VISIT - 7.8(L counseling the patient face to face. The total time spent in the appointment was {CHL ONC TIME VISIT - FYBOF:[id number]} and more than 50% was on counseling and review of test results     POEUM:[id number]}, NP 04/08/21

## 2021-04-08 NOTE — Telephone Encounter (Signed)
I spoke with pt regarding her missed appt today. She would like to have it r/s.  Scheduling message sent.

## 2021-04-09 ENCOUNTER — Other Ambulatory Visit: Payer: Self-pay

## 2021-04-09 ENCOUNTER — Encounter: Payer: Self-pay | Admitting: Nurse Practitioner

## 2021-04-09 ENCOUNTER — Inpatient Hospital Stay: Payer: 59 | Attending: Nurse Practitioner

## 2021-04-09 ENCOUNTER — Inpatient Hospital Stay (HOSPITAL_BASED_OUTPATIENT_CLINIC_OR_DEPARTMENT_OTHER): Payer: 59 | Admitting: Nurse Practitioner

## 2021-04-09 VITALS — BP 149/90 | HR 70 | Temp 98.1°F | Resp 17 | Wt 251.7 lb

## 2021-04-09 DIAGNOSIS — D509 Iron deficiency anemia, unspecified: Secondary | ICD-10-CM | POA: Diagnosis not present

## 2021-04-09 DIAGNOSIS — E119 Type 2 diabetes mellitus without complications: Secondary | ICD-10-CM | POA: Insufficient documentation

## 2021-04-09 DIAGNOSIS — Z79899 Other long term (current) drug therapy: Secondary | ICD-10-CM | POA: Insufficient documentation

## 2021-04-09 DIAGNOSIS — J45909 Unspecified asthma, uncomplicated: Secondary | ICD-10-CM | POA: Insufficient documentation

## 2021-04-09 DIAGNOSIS — Z9884 Bariatric surgery status: Secondary | ICD-10-CM | POA: Diagnosis not present

## 2021-04-09 DIAGNOSIS — I10 Essential (primary) hypertension: Secondary | ICD-10-CM | POA: Insufficient documentation

## 2021-04-09 DIAGNOSIS — E669 Obesity, unspecified: Secondary | ICD-10-CM | POA: Diagnosis not present

## 2021-04-09 DIAGNOSIS — D573 Sickle-cell trait: Secondary | ICD-10-CM | POA: Diagnosis not present

## 2021-04-09 LAB — CBC WITH DIFFERENTIAL (CANCER CENTER ONLY)
Abs Immature Granulocytes: 0.01 10*3/uL (ref 0.00–0.07)
Basophils Absolute: 0 10*3/uL (ref 0.0–0.1)
Basophils Relative: 1 %
Eosinophils Absolute: 0.1 10*3/uL (ref 0.0–0.5)
Eosinophils Relative: 2 %
HCT: 38.4 % (ref 36.0–46.0)
Hemoglobin: 12.5 g/dL (ref 12.0–15.0)
Immature Granulocytes: 0 %
Lymphocytes Relative: 39 %
Lymphs Abs: 1.9 10*3/uL (ref 0.7–4.0)
MCH: 26.7 pg (ref 26.0–34.0)
MCHC: 32.6 g/dL (ref 30.0–36.0)
MCV: 81.9 fL (ref 80.0–100.0)
Monocytes Absolute: 0.4 10*3/uL (ref 0.1–1.0)
Monocytes Relative: 8 %
Neutro Abs: 2.5 10*3/uL (ref 1.7–7.7)
Neutrophils Relative %: 50 %
Platelet Count: 240 10*3/uL (ref 150–400)
RBC: 4.69 MIL/uL (ref 3.87–5.11)
RDW: 13.9 % (ref 11.5–15.5)
WBC Count: 5 10*3/uL (ref 4.0–10.5)
nRBC: 0 % (ref 0.0–0.2)

## 2021-04-09 LAB — FERRITIN: Ferritin: 47 ng/mL (ref 11–307)

## 2021-04-09 LAB — IRON AND TIBC
Iron: 61 ug/dL (ref 41–142)
Saturation Ratios: 21 % (ref 21–57)
TIBC: 288 ug/dL (ref 236–444)
UIBC: 227 ug/dL (ref 120–384)

## 2021-04-09 LAB — VITAMIN D 25 HYDROXY (VIT D DEFICIENCY, FRACTURES): Vit D, 25-Hydroxy: 31.13 ng/mL (ref 30–100)

## 2021-04-09 NOTE — Progress Notes (Signed)
Vitamin D

## 2021-04-09 NOTE — Progress Notes (Signed)
[facility]   Telephone:(336) [phone] Fax:(336) [phone]   Clinic Follow up Note   Patient Care Team: Georgann Housekeeper, MD as PCP - General (Internal Medicine) 04/09/2021  CHIEF COMPLAINT: Follow-up anemia  CURRENT THERAPY:  1.Oral liquid iron and oral B12 once daily 2.IV Venofer 200 mg as needed, last given 04/2020  INTERVAL HISTORY: Ms. Grosso returns for follow-up as scheduled.  She was last seen by me 08/07/2020.  She is doing well, energy and appetite are normal.  She gained weight recently on a trip..  She ran out of oral iron a couple weeks ago, tolerates liquid best.  She still has cold sensation in the tips of her toes, denies neuropathy.  Denies other symptoms of anemia such as fatigue, exertional dyspnea, signs of bleeding, or any other concerns.   MEDICAL HISTORY:  Past Medical History:  Diagnosis Date   Asthma    Hypertension    Varicose veins     SURGICAL HISTORY: Past Surgical History:  Procedure Laterality Date   ABDOMINAL HYSTERECTOMY     ROUX-EN-Y PROCEDURE  2009    I have reviewed the social history and family history with the patient and they are unchanged from previous note.  ALLERGIES:  is allergic to amoxicillin and latex.  MEDICATIONS:  Current Outpatient Medications  Medication Sig Dispense Refill   ferrous sulfate 325 (65 FE) MG tablet Take 325 mg by mouth daily with breakfast.     Loratadine 10 MG CAPS Take by mouth.     Multiple Vitamins-Minerals (CENTRUM ADULTS PO) Take by mouth.     valsartan-hydrochlorothiazide (DIOVAN-HCT) 160-12.5 MG per tablet Take 1 tablet by mouth daily.     Vitamin D, Ergocalciferol, (DRISDOL) 1.25 MG (50000 UNIT) CAPS capsule Take 1 capsule (50,000 Units total) by mouth every 7 (seven) days. 4 capsule 0   No current facility-administered medications for this visit.    PHYSICAL EXAMINATION:  Vitals:   04/09/21 0857  BP: (!) 149/90  Pulse: 70  Resp: 17  Temp: 98.1 F (36.7 C)  SpO2: 100%    Filed Weights   04/09/21 0857  Weight: 251 lb 11.2 oz (114.2 kg)    GENERAL:alert, no distress and comfortable SKIN: No rash EYES:  sclera clear NECK: Without mass LUNGS: clear with normal breathing effort HEART: regular rate & rhythm, no lower extremity edema NEURO: alert & oriented x 3 with fluent speech, no focal motor/sensory deficits  LABORATORY DATA:  I have reviewed the data as listed CBC Latest Ref Rng & Units 04/09/2021 12/05/2020 10/15/2020  WBC 4.0 - 10.5 K/uL 5.0 5.8 5.1  Hemoglobin 12.0 - 15.0 g/dL 69.6 29.5 28.4  Hematocrit 36.0 - 46.0 % 38.4 38.5 41.7  Platelets 150 - 400 K/uL 240 233 168    RADIOGRAPHIC STUDIES: I have personally reviewed the radiological images as listed and agreed with the findings in the report. No results found.   ASSESSMENT & PLAN: 60 yo female with   1. Chronic anemia, IDA secondary to poor dietary absorption -H/o chronic mild microcytic, hypochromic anemia with Hgb in the 10 range dating back at least to 2009 when she underwent gastric bypass. - No evidence of GI bleeding on colonoscopy in 11/2019 per Dr. Loreta Ave -She presented with recurrent symptomatic anemia in 03/2020, work-up showed normal B12, folic acid, copper.  Iron studies consistent with IDA likely from poor dietary absorption from gastric bypass. Main symptom is cold sensation in fingertips and toes. -S/p IV Venofer 200 mg x 5 completed in  04/2020.  Symptoms improved and IDA resolved, she responded very well to IV iron -Continue oral liquid iron once daily   2. Obesity, health maintenance -She is s/p roux-en-Y gastric bypass in 03/28/2008 -she gained weight back over the last year due to stress from her husband passing away and covid19 pandemic. -She was referred back to weight loss management by Dr. Donette Larry but did not take Wellbutrin. -She is UTD on mammogram which was negative in 11/2019. Colonoscopy on 11/30/19 showed 2 sessile polyps in the sigmoid colon, otherwise normal, recall  7-10 years per Dr. Loreta Ave. She is s/p hysterectomy.  -Continue intentional weight loss   3. HTN, pre-DM, asthma, sickle cell trait, h/o covid19 infection 03/2019 -followed by PCP  Disposition: Ms. Deshmukh appears well.  She has no signs of recurrent IDA.  Tolerates liquid iron once daily, will resume, and oral B12.  IDA resolved after IV Venofer in 04/2020, she responded very well.  Labs reviewed, CBC and iron studies all normal. She completed 2 months of high-dose vitamin D, will repeat level today, in the meantime she will start 5000 IUs daily.  We discussed health maintenance, she is up-to-date on age-appropriate cancer screenings and vaccines.  Follow-up with PCP as scheduled and return for lab and follow-up with me in 1 year, or sooner if she has recurrent signs of IDA.   All questions were answered. The patient knows to call the clinic with any problems, questions or concerns. No barriers to learning were detected.     Pollyann Samples, NP 04/09/21

## 2021-04-10 ENCOUNTER — Telehealth: Payer: Self-pay | Admitting: Hematology

## 2021-04-10 NOTE — Telephone Encounter (Signed)
Scheduled follow-up appointment per 7/19 los. Patient is aware. 

## 2021-04-26 ENCOUNTER — Encounter: Payer: Self-pay | Admitting: Nurse Practitioner

## 2021-08-17 IMAGING — MG DIGITAL SCREENING BILAT W/ TOMO W/ CAD
8 series · 8 of 24 positions shown · non-contrast
Comparison: Previous exam(s).

CLINICAL DATA: Screening.

EXAM:
DIGITAL SCREENING BILATERAL MAMMOGRAM WITH TOMO AND CAD

[L MLO synth-2D]
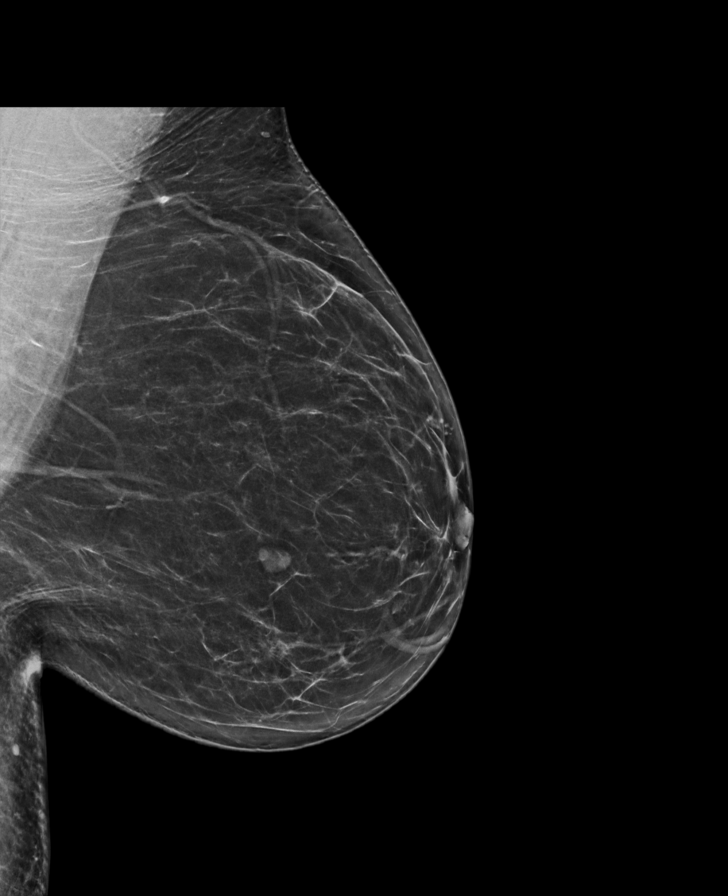

[R CC synth-2D]
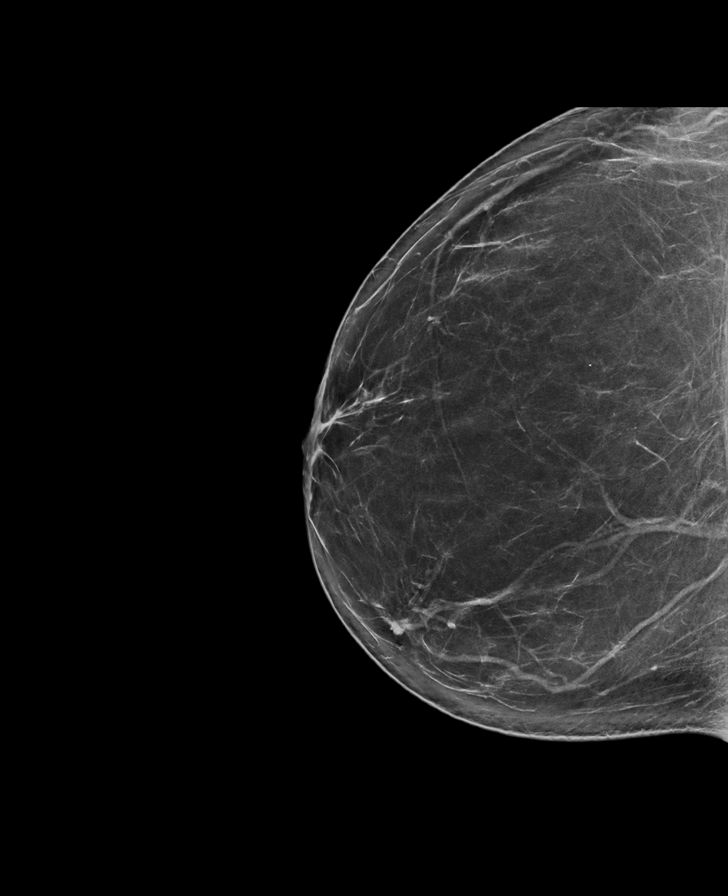

[R MLO synth-2D]
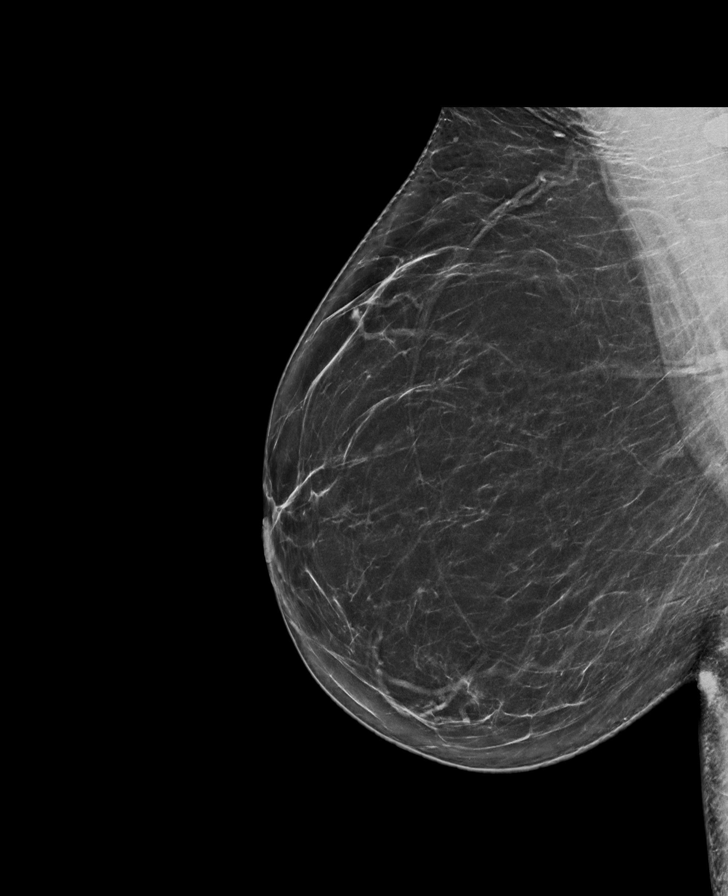

[L CC synth-2D]
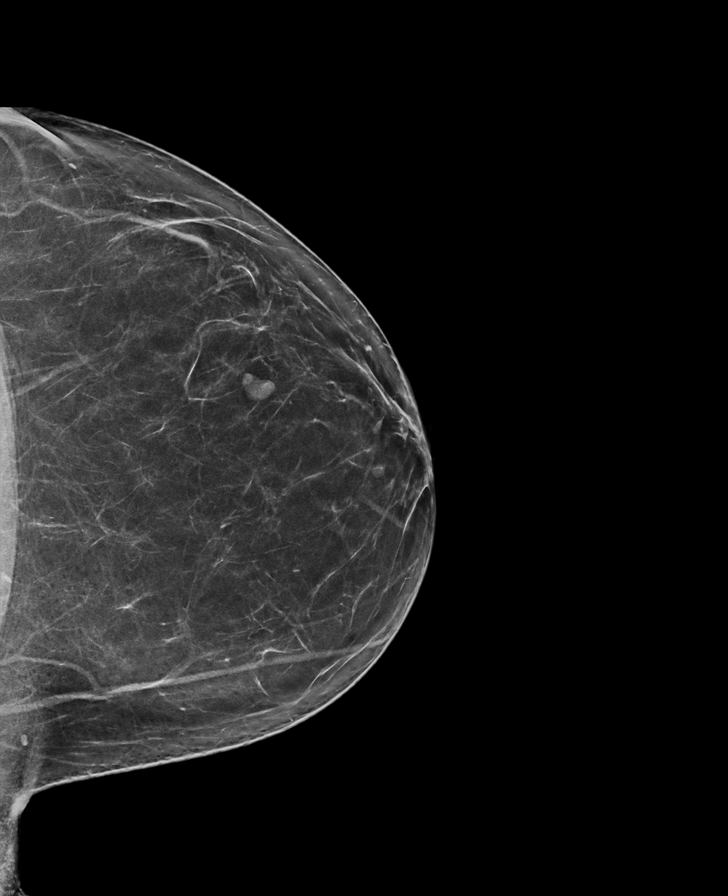

[R MLO tomo · tomo slice 41/80.0]
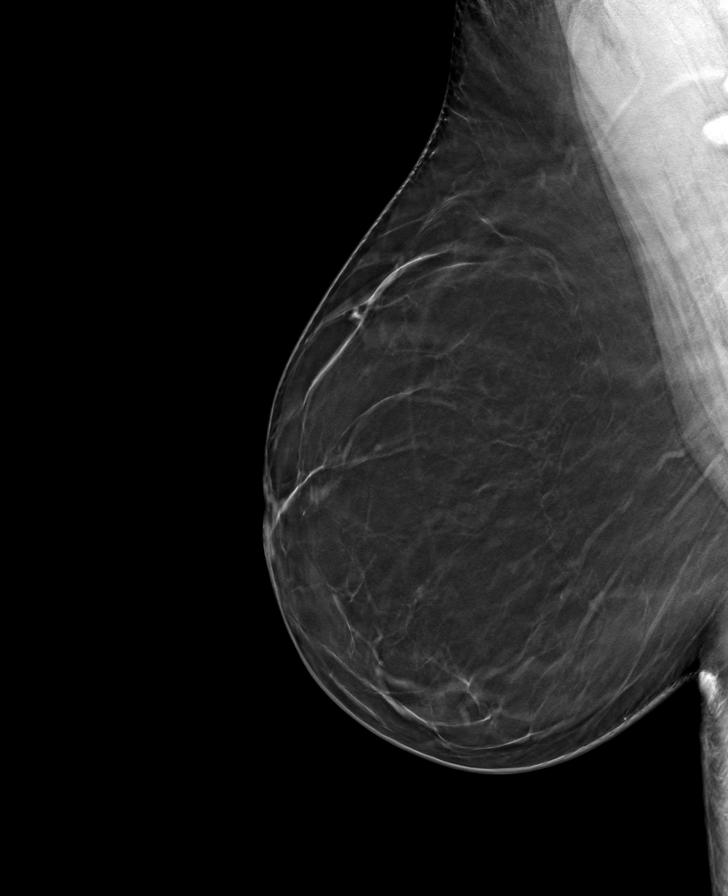

[R CC tomo · tomo slice 39/78.0]
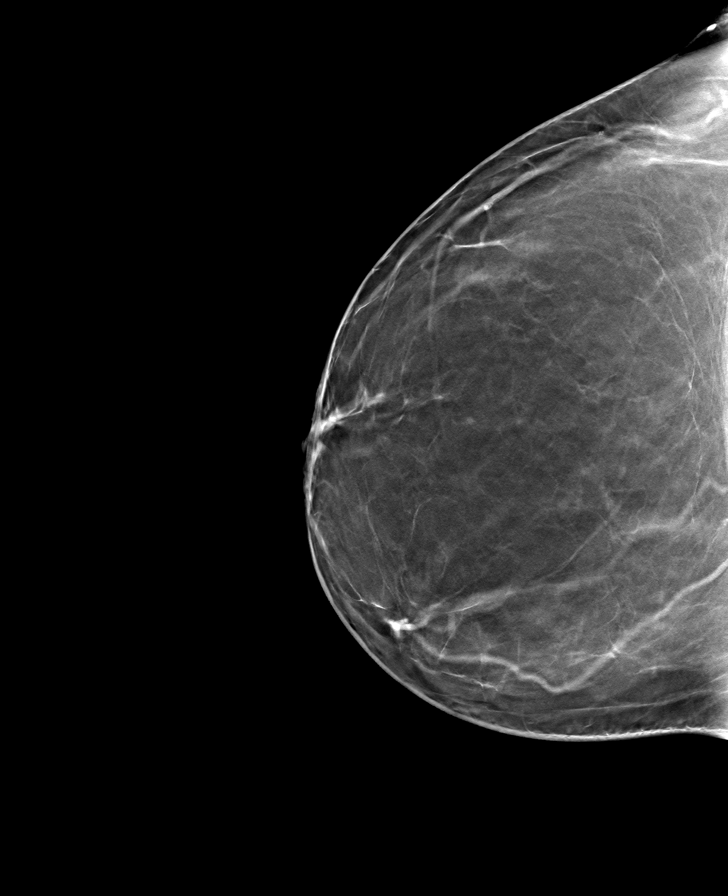

[L MLO tomo · tomo slice 39/77.0]
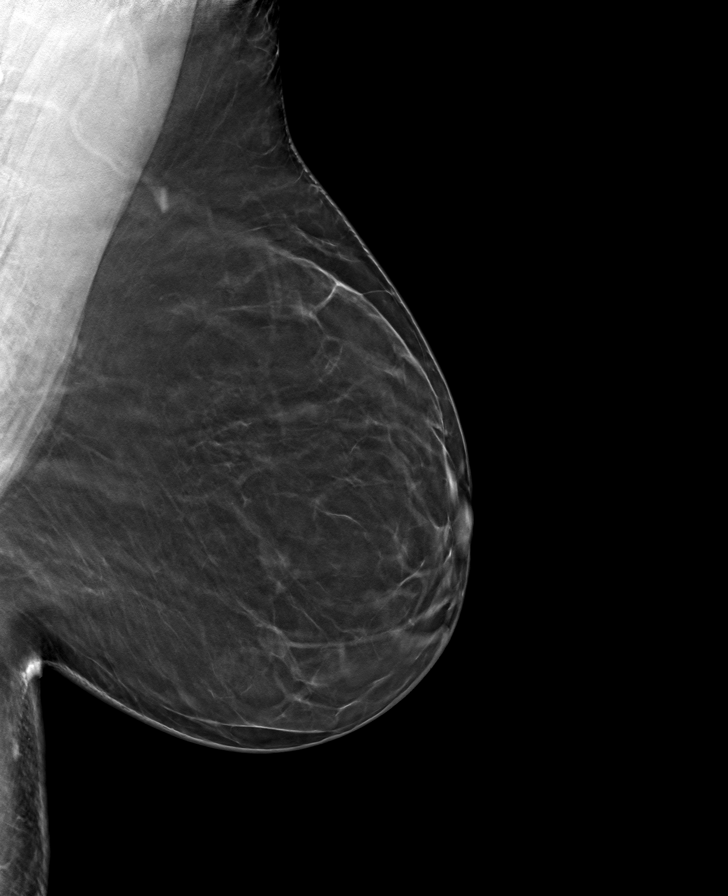

[L CC tomo · tomo slice 37/72.0]
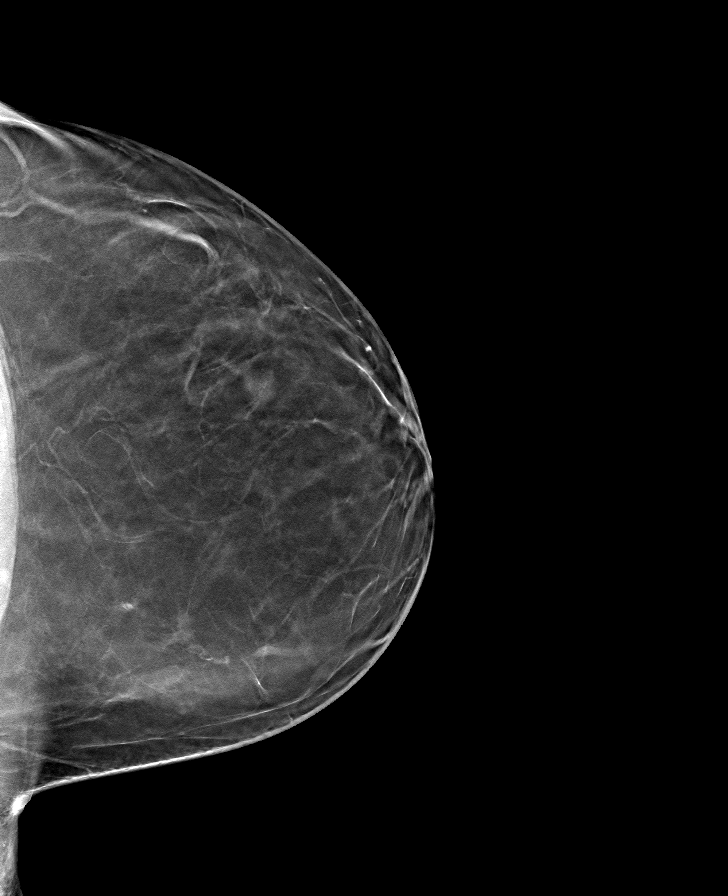

[8 of 24 positions shown; findings below may reference images not displayed]

ACR Breast Density Category b: There are scattered areas of
fibroglandular density.
FINDINGS: There are no findings suspicious for malignancy. Images were
processed with CAD.
IMPRESSION: No mammographic evidence of malignancy. A result letter of this
screening mammogram will be mailed directly to the patient.

RECOMMENDATION:
Screening mammogram in one year. (Code:[id number])

BI-RADS CATEGORY  1: Negative.

## 2021-09-19 ENCOUNTER — Encounter: Payer: Self-pay | Admitting: Nurse Practitioner

## 2021-12-24 DIAGNOSIS — R350 Frequency of micturition: Secondary | ICD-10-CM | POA: Diagnosis not present

## 2021-12-24 DIAGNOSIS — R7303 Prediabetes: Secondary | ICD-10-CM | POA: Diagnosis not present

## 2021-12-24 DIAGNOSIS — H3540 Unspecified peripheral retinal degeneration: Secondary | ICD-10-CM | POA: Diagnosis not present

## 2021-12-24 DIAGNOSIS — I10 Essential (primary) hypertension: Secondary | ICD-10-CM | POA: Diagnosis not present

## 2021-12-24 DIAGNOSIS — H35033 Hypertensive retinopathy, bilateral: Secondary | ICD-10-CM | POA: Diagnosis not present

## 2021-12-24 DIAGNOSIS — H43823 Vitreomacular adhesion, bilateral: Secondary | ICD-10-CM | POA: Diagnosis not present

## 2021-12-24 DIAGNOSIS — R0609 Other forms of dyspnea: Secondary | ICD-10-CM | POA: Diagnosis not present

## 2021-12-24 DIAGNOSIS — H353132 Nonexudative age-related macular degeneration, bilateral, intermediate dry stage: Secondary | ICD-10-CM | POA: Diagnosis not present

## 2022-01-01 DIAGNOSIS — G4733 Obstructive sleep apnea (adult) (pediatric): Secondary | ICD-10-CM | POA: Diagnosis not present

## 2022-01-07 DIAGNOSIS — E782 Mixed hyperlipidemia: Secondary | ICD-10-CM | POA: Diagnosis not present

## 2022-01-07 DIAGNOSIS — I10 Essential (primary) hypertension: Secondary | ICD-10-CM | POA: Diagnosis not present

## 2022-01-07 DIAGNOSIS — Z9884 Bariatric surgery status: Secondary | ICD-10-CM | POA: Diagnosis not present

## 2022-01-07 DIAGNOSIS — Z0000 Encounter for general adult medical examination without abnormal findings: Secondary | ICD-10-CM | POA: Diagnosis not present

## 2022-01-07 DIAGNOSIS — R7303 Prediabetes: Secondary | ICD-10-CM | POA: Diagnosis not present

## 2022-01-07 DIAGNOSIS — Z13220 Encounter for screening for lipoid disorders: Secondary | ICD-10-CM | POA: Diagnosis not present

## 2022-01-10 ENCOUNTER — Encounter: Payer: Self-pay | Admitting: Internal Medicine

## 2022-01-10 ENCOUNTER — Encounter: Payer: Self-pay | Admitting: Nurse Practitioner

## 2022-01-10 ENCOUNTER — Ambulatory Visit: Payer: BC Managed Care – PPO | Admitting: Internal Medicine

## 2022-01-10 VITALS — BP 120/66 | HR 79 | Ht 65.5 in | Wt 249.2 lb

## 2022-01-10 DIAGNOSIS — R0600 Dyspnea, unspecified: Secondary | ICD-10-CM | POA: Diagnosis not present

## 2022-01-10 NOTE — Patient Instructions (Addendum)
Medication Instructions:  ?Your physician recommends that you continue on your current medications as directed. Please refer to the Current Medication list given to you today. ? ?*If you need a refill on your cardiac medications before your next appointment, please call your pharmacy* ? ? ?Lab Work: ?None ?If you have labs (blood work) drawn today and your tests are completely normal, you will receive your results only by: ?MyChart Message (if you have MyChart) OR ?A paper copy in the mail ?If you have any lab test that is abnormal or we need to change your treatment, we will call you to review the results. ? ? ?Testing/Procedures: ?Your physician has requested that you have an echocardiogram. Echocardiography is a painless test that uses sound waves to create images of your heart. It provides your doctor with information about the size and shape of your heart and how well your heart?s chambers and valves are working. This procedure takes approximately one hour. There are no restrictions for this procedure. ? ?Your physician has requested that you have an exercise tolerance test. For further information please visit [email or web address]. Please also follow instruction sheet, as given. ? ?Follow-Up: ?At [facility], you and your health needs are our priority.  As part of our continuing mission to provide you with exceptional heart care, we have created designated Provider Care Teams.  These Care Teams include your primary Cardiologist (physician) and Advanced Practice Providers (APPs -  Physician Assistants and Nurse Practitioners) who all work together to provide you with the care you need, when you need it. ? ?Your next appointment:   ?As Needed ? ? ? ?

## 2022-01-10 NOTE — Progress Notes (Signed)
?Cardiology Office Note:   ? ?Date:  01/10/2022  ? ?ID:  Charlene Pearson, DOB 04/10/1961, MRN [id number] ? ?PCP:  Georgann Housekeeper, MD ?  ?[facility] Providers ?Cardiologist:  None    ? ?Referring MD: Georgann Housekeeper, MD  ? ?No chief complaint on file. ?DOE ? ?History of Present Illness:   ? ?Charlene Pearson is a 61 y.o. female with a hx of obesity BMI 40, asthma, HTN, referral for DOE ? ?She was seen by her PCP at [facility]. She had COVID in 2020. She had shortness of breath then. This has been persistent. When she is going up the steps, she notes dyspnea. She moved recently, and noted lower stamina. No chest pressure. No progression of symptoms in 3 years ? ?She was having dyspnea for the past several weeks with walking upstairs  ? ?Has sleep apnea, pending CPAP machine. ? ?She never smoked. ? ?She denies PND, orthopnea or LE edema.  ? ?Past Medical History:  ?Diagnosis Date  ? Asthma   ? Hypertension   ? Varicose veins   ? ? ?Past Surgical History:  ?Procedure Laterality Date  ? ABDOMINAL HYSTERECTOMY    ? ROUX-EN-Y PROCEDURE  2009  ? ? ?Current Medications: ?Current Meds  ?Medication Sig  ? albuterol (VENTOLIN HFA) 108 (90 Base) MCG/ACT inhaler SMARTSIG:2 Puff(s) Via Inhaler PRN  ? ferrous sulfate 325 (65 FE) MG tablet Take 325 mg by mouth daily with breakfast.  ? Loratadine 10 MG CAPS Take by mouth.  ? Multiple Vitamins-Minerals (CENTRUM ADULTS PO) Take by mouth.  ? valsartan-hydrochlorothiazide (DIOVAN-HCT) 160-12.5 MG per tablet Take 1 tablet by mouth daily.  ? Vitamin D, Ergocalciferol, (DRISDOL) 1.25 MG (50000 UNIT) CAPS capsule Take 1 capsule (50,000 Units total) by mouth every 7 (seven) days.  ?  ? ?Allergies:   Amoxicillin and Latex  ? ?Social History  ? ?Socioeconomic History  ? Marital status: Divorced  ?  Spouse name: Not on file  ? Number of children: Not on file  ? Years of education: Not on file  ? Highest education level: Not on file  ?Occupational History  ? 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 Smoking status: Never  ? Smokeless tobacco: Never  ?Substance and Sexual Activity  ? Alcohol use: No  ? Drug use: No  ? Sexual activity: Not on file  ?Other Topics Concern  ? Not on file  ?Social History Narrative  ? ** Merged History Encounter **  ?    ? ?Social Determinants of Health  ? ?Financial Resource Strain: Not on file  ?Food Insecurity: Not on file  ?Transportation Needs: Not on file  ?Physical Activity: Not on file  ?Stress: Not on file  ?Social Connections: Not on file  ?  ? ?Family History: ?The patient's family history includes AAA (abdominal aortic aneurysm) in her father; Anxiety disorder in her mother; Diabetes in her mother and son; High Cholesterol in her father and mother; High blood pressure in her father and mother; Obesity in her mother; Sleep apnea in her father and mother; Stroke in her mother. Mother was in her 22s ? ?ROS:   ?Please see the history of present illness.    ? All other systems reviewed and are negative. ? ?EKGs/Labs/Other Studies Reviewed:   ? ?The following studies were reviewed today: ? ? ?EKG:  EKG is  ordered today.  The ekg ordered today demonstrates  ? ?EKG-NSR, LVH ? ?Recent Labs: ?04/09/2021: Hemoglobin 12.5; Platelet Count 240  ?  Recent Lipid Panel ?   ?Component Value Date/Time  ? TRIG 102 04/10/2019 0213  ? ? ? ?Risk Assessment/Calculations:   ?  ? ?    ? ?Physical Exam:   ? ?VS:  ?Vitals:  ? 01/10/22 1610  ?BP: 120/66  ?Pulse: 79  ?SpO2: 99%  ? ? ? ? BP 120/66   Pulse 79   Ht 5' 5.5" (1.664 m)   Wt 249 lb 3.2 oz (113 kg)   SpO2 99%   BMI 40.84 kg/m?    ? ?Wt Readings from Last 3 Encounters:  ?01/10/22 249 lb 3.2 oz (113 kg)  ?04/09/21 251 lb 11.2 oz (114.2 kg)  ?02/11/21 241 lb (109.3 kg)  ?  ? ?GEN:  Well nourished, well developed in no acute distress ?HEENT: Normal ?NECK: No JVD; No carotid bruits ?LYMPHATICS: No lymphadenopathy ?CARDIAC: RRR, no murmurs, rubs, gallops ?RESPIRATORY:  Clear to auscultation without rales, wheezing or  rhonchi  ?ABDOMEN: Soft, non-tender, non-distended ?MUSCULOSKELETAL:  No edema; No deformity  ?SKIN: Warm and dry ?NEUROLOGIC:  Alert and oriented x 3 ?PSYCHIATRIC:  Normal affect  ? ?ASSESSMENT:   ? ?#DOE: Ddx includes coronary etiology v. Diastolic dysfunction. Certainly weight is contributing. 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 No decompensated CHF signs. Will get an echo to assess diastolgy, PA pressures, and ensure no structural heart disease. Will get ETT as well to r/o signs of ischemic dx. ? ?HTN- losartan 10 mg daily. Valsartan/Hctz 160-12.5. ? ?PLAN:   ? ?In order of problems listed above: ? ?ETT ?TTE ?Follow up as needed (if the test is abnormal, will bring her back) ? ?   ? ?   ?Medication Adjustments/Labs and Tests Ordered: ?Current medicines are reviewed at length with the patient today.  Concerns regarding medicines are outlined above.  ?Orders Placed This Encounter  ?Procedures  ? Cardiac Stress Test: Informed Consent Details: Physician/Practitioner Attestation; Transcribe to consent form and obtain patient signature  ? Exercise Tolerance Test  ? EKG 12-Lead  ? ECHOCARDIOGRAM COMPLETE  ? ?No orders of the defined types were placed in this encounter. ? ? ?Patient Instructions  ?Medication Instructions:  ?Your physician recommends that you continue on your current medications as directed. Please refer to the Current Medication list given to you today. ? ?*If you need a refill on your cardiac medications before your next appointment, please call your pharmacy* ? ? ?Lab Work: ?None ?If you have labs (blood work) drawn today and your tests are completely normal, you will receive your results only by: ?MyChart Message (if you have MyChart) OR ?A paper copy in the mail ?If you have any lab test that is abnormal or we need to change your treatment, we will call you to review the  results. ? ? ?Testing/Procedures: ?Your physician has requested that you have an echocardiogram. Echocardiography is a painless test that uses sound waves to create images of your heart. It provides your doctor with information about the size and shape of your heart and how well your heart?s chambers and valves are working. This procedure takes approximately one hour. There are no restrictions for this procedure. ? ?Your physician has requested that you have an exercise tolerance test. For further information please visit [email or web address]. Please also follow instruction sheet, as given. ? ?Follow-Up: ?[facility], you and your health needs are our priority.  As part of our continuing mission to provide you  with exceptional heart care, we have created designated Provider Care Teams.  These Care Teams include your primary Cardiologist (physician) and Advanced Practice Providers (APPs -  Physician Assistants and Nurse Practitioners) who all work together to provide you with the care you need, when you need it. ? ?Your next appointment:   ?As Needed ? ? ?  ? ?Signed, ?Maisie Fus, MD  ?01/10/2022 4:52 PM    ?[facility] ?

## 2022-01-31 DIAGNOSIS — G4733 Obstructive sleep apnea (adult) (pediatric): Secondary | ICD-10-CM | POA: Diagnosis not present

## 2022-02-07 ENCOUNTER — Telehealth (HOSPITAL_COMMUNITY): Payer: Self-pay | Admitting: *Deleted

## 2022-02-07 NOTE — Telephone Encounter (Signed)
Close encounter 

## 2022-02-11 ENCOUNTER — Other Ambulatory Visit (HOSPITAL_COMMUNITY): Payer: BC Managed Care – PPO

## 2022-02-11 ENCOUNTER — Ambulatory Visit (HOSPITAL_BASED_OUTPATIENT_CLINIC_OR_DEPARTMENT_OTHER): Payer: BC Managed Care – PPO

## 2022-02-11 ENCOUNTER — Encounter (HOSPITAL_COMMUNITY): Payer: BC Managed Care – PPO

## 2022-02-11 ENCOUNTER — Ambulatory Visit (HOSPITAL_COMMUNITY)
Admission: RE | Admit: 2022-02-11 | Discharge: 2022-02-11 | Disposition: A | Discharge status: Home / Self Care | Payer: BC Managed Care – PPO | Source: Ambulatory Visit | Attending: Internal Medicine | Admitting: Internal Medicine

## 2022-02-11 ENCOUNTER — Encounter: Payer: Self-pay | Admitting: Nurse Practitioner

## 2022-02-11 DIAGNOSIS — R0600 Dyspnea, unspecified: Secondary | ICD-10-CM | POA: Insufficient documentation

## 2022-02-11 DIAGNOSIS — R0609 Other forms of dyspnea: Secondary | ICD-10-CM | POA: Diagnosis not present

## 2022-02-11 LAB — EXERCISE TOLERANCE TEST
Angina Index: 0
Duke Treadmill Score: 6
Estimated workload: 7
Exercise duration (min): 6 min
Exercise duration (sec): 4 s
MPHR: 162 {beats}/min
Peak HR: 146 {beats}/min
Percent HR: 90 %
Rest HR: 85 {beats}/min
ST Depression (mm): 0 mm

## 2022-02-11 LAB — ECHOCARDIOGRAM COMPLETE
Area-P 1/2: 2.49 cm2
S' Lateral: 3 cm

## 2022-03-03 DIAGNOSIS — G4733 Obstructive sleep apnea (adult) (pediatric): Secondary | ICD-10-CM | POA: Diagnosis not present

## 2022-03-14 ENCOUNTER — Encounter: Payer: Self-pay | Admitting: Nurse Practitioner

## 2022-04-02 DIAGNOSIS — G4733 Obstructive sleep apnea (adult) (pediatric): Secondary | ICD-10-CM | POA: Diagnosis not present

## 2022-04-08 ENCOUNTER — Other Ambulatory Visit: Payer: Self-pay

## 2022-04-08 DIAGNOSIS — D509 Iron deficiency anemia, unspecified: Secondary | ICD-10-CM

## 2022-04-08 NOTE — Progress Notes (Unsigned)
[facility]   Telephone:(336) [phone] Fax:(336) [phone]   Clinic Follow up Note   Patient Care Team: Georgann Housekeeper, MD as PCP - General (Internal Medicine) 04/09/2022  CHIEF COMPLAINT: Follow-up anemia  CURRENT THERAPY:  1.Oral liquid iron and oral B12 once daily 2.IV Venofer 200 mg as needed, last given 04/2020  INTERVAL HISTORY: Charlene Pearson returns for follow-up as scheduled, last seen by me 04/09/2021.  She got married and relocated to [place] this year.  She has not established with a PCP or hematologist they are but is in no rush, as she still commutes to [place] in [place] area often.  She has been off liquid iron for a few months now, she ran out.  She continues to take oral B12 daily as well as other vitamins.  She has intermittent fatigue she attributes to age and possibly long COVID.  She remains active with her young granddaughter. Notes occasional low back pain and radiating pain down the back of her right leg if she sits/drives too long or does certain movements.  Denies bleeding, infection, other pain, or any other new specific complaints.  All other systems were reviewed with the patient and are negative.  MEDICAL HISTORY:  Past Medical History:  Diagnosis Date   Asthma    Hypertension    Varicose veins     SURGICAL HISTORY: Past Surgical History:  Procedure Laterality Date   ABDOMINAL HYSTERECTOMY     ROUX-EN-Y PROCEDURE  2009    I have reviewed the social history and family history with the patient and they are unchanged from previous note.  ALLERGIES:  is allergic to amoxicillin and latex.  MEDICATIONS:  Current Outpatient Medications  Medication Sig Dispense Refill   albuterol (VENTOLIN HFA) 108 (90 Base) MCG/ACT inhaler SMARTSIG:2 Puff(s) Via Inhaler PRN     ferrous sulfate 325 (65 FE) MG tablet Take 325 mg by mouth daily with breakfast.     Loratadine 10 MG CAPS Take by mouth.     Multiple Vitamins-Minerals (CENTRUM ADULTS PO)  Take by mouth.     valsartan-hydrochlorothiazide (DIOVAN-HCT) 160-12.5 MG per tablet Take 1 tablet by mouth daily.     Vitamin D, Ergocalciferol, (DRISDOL) 1.25 MG (50000 UNIT) CAPS capsule Take 1 capsule (50,000 Units total) by mouth every 7 (seven) days. 4 capsule 0   No current facility-administered medications for this visit.    PHYSICAL EXAMINATION: ECOG PERFORMANCE STATUS: 0 - Asymptomatic  Vitals:   04/09/22 0913  BP: 131/70  Pulse: 66  Temp: 98 F (36.7 C)  SpO2: 97%   Filed Weights   04/09/22 0913  Weight: 256 lb 1.6 oz (116.2 kg)    GENERAL:alert, no distress and comfortable SKIN: No rash EYES: sclera clear LUNGS: clear, normal breathing effort HEART: regular rate & rhythm, no lower extremity edema ABDOMEN:abdomen soft, non-tender and normal bowel sounds NEURO: alert & oriented x 3 with fluent speech, no focal motor/sensory deficits  LABORATORY DATA:  I have reviewed the data as listed    Latest Ref Rng & Units 04/09/2022    8:43 AM 04/09/2021    8:46 AM 12/05/2020    8:20 AM  CBC  WBC 4.0 - 10.5 K/uL 4.8  5.0  5.8   Hemoglobin 12.0 - 15.0 g/dL 51.7  61.6  07.3   Hematocrit 36.0 - 46.0 % 37.8  38.4  38.5   Platelets 150 - 400 K/uL 253  240  233         Latest Ref Rng &  Units 04/09/2022    8:43 AM 04/11/2019    2:45 AM 04/10/2019    2:13 AM  CMP  Glucose 70 - 99 mg/dL 93  76  85   BUN 6 - 20 mg/dL 13  20  20    Creatinine 0.44 - 1.00 mg/dL 0.78  0.72  0.70   Sodium 135 - 145 mmol/L 142  142  143   Potassium 3.5 - 5.1 mmol/L 3.5  3.8  3.4   Chloride 98 - 111 mmol/L 107  107  107   CO2 22 - 32 mmol/L 31  23  27    Calcium 8.9 - 10.3 mg/dL 9.1  8.4  8.4   Total Protein 6.5 - 8.1 g/dL 7.0  6.6  6.7   Total Bilirubin 0.3 - 1.2 mg/dL 0.3  0.1  <0.1   Alkaline Phos 38 - 126 U/L 91  62  64   AST 15 - 41 U/L 21  31  44   ALT 0 - 44 U/L 16  35  39       RADIOGRAPHIC STUDIES: I have personally reviewed the radiological images as listed and agreed with the  findings in the report. No results found.   ASSESSMENT & PLAN:  61 yo female with   1. Chronic anemia, IDA secondary to poor dietary absorption -H/o chronic mild microcytic, hypochromic anemia with Hgb in the 10 range dating back at least to 2009 when she underwent gastric bypass. - No evidence of GI bleeding on colonoscopy in 11/2019 per Dr. Collene Mares -She presented with recurrent symptomatic anemia in 03/2020, work-up showed normal 123456, folic acid, copper.  Iron studies consistent with IDA likely from poor dietary absorption from gastric bypass. Main symptom is cold sensation in fingertips and toes. -S/p IV Venofer 200 mg x 5 completed in 04/2020.  Symptoms improved and IDA resolved, she responded very well to IV iron -she tolerates oral liquid iron best, takes it intermittently, none in past few months -Charlene Pearson is clinically doing well.  No symptoms or lab evidence of anemia.  No need for IV iron at this time.  Given her previous gastric bypass and low oral iron absorption, I recommend for her to take it at least 3 times a week indefinitely. -I will see her back in 1 year, unless she establishes with a new PCP/hematologist in [place] then she can cancel the appointment. -She knows to call sooner with signs of recurrent anemia   2. Health maintenance -She is s/p roux-en-Y gastric bypass in 03/28/2008 -she gained weight back due to stress from her husband passing away and covid19 pandemic. -She was referred back to weight loss management by Dr. Lysle Rubens but did not take Wellbutrin. - Colonoscopy on 11/30/19 showed 2 sessile polyps in the sigmoid colon, otherwise normal, recall 7-10 years per Dr. Collene Mares.  -She is s/p hysterectomy.  -overdue screening mammogram, last in 10/2020 negative; I placed an order today -She is newly married and relocated to [place] near [place].  She is doing well overall continue healthy active lifestyle   3. HTN, pre-DM, asthma, sickle cell trait, h/o covid19 infection  03/2019, low back with with sciatica -followed by PCP    Plan: -Labs reviewed, anemia resolved -Follow-up on the pending ferritin level from today -Continue liquid iron at least 3 times a week indefinitely due to history of gastric bypass and low absorption -Lab and follow-up in 1 year, or sooner if needed -She may cancel if she establishes with a new PCP  or hematologist closer to her new home -Referral for screening mammogram   Orders Placed This Encounter  Procedures   MM 3D SCREEN BREAST BILATERAL    Standing Status:   Future    Standing Expiration Date:   04/10/2023    Order Specific Question:   Reason for Exam (SYMPTOM  OR DIAGNOSIS REQUIRED)    Answer:   screening    Order Specific Question:   Is the patient pregnant?    Answer:   No    Order Specific Question:   Preferred imaging location?    Answer:   [facility]   All questions were answered. The patient knows to call the clinic with any problems, questions or concerns. No barriers to learning was detected. I spent 20 minutes counseling the patient face to face. The total time spent in the appointment was 30 minutes and more than 50% was on counseling and review of test results     Pollyann Samples, NP 04/09/22

## 2022-04-09 ENCOUNTER — Other Ambulatory Visit: Payer: Self-pay

## 2022-04-09 ENCOUNTER — Inpatient Hospital Stay: Payer: BC Managed Care – PPO | Attending: Nurse Practitioner | Admitting: Nurse Practitioner

## 2022-04-09 ENCOUNTER — Inpatient Hospital Stay: Payer: BC Managed Care – PPO

## 2022-04-09 ENCOUNTER — Encounter: Payer: Self-pay | Admitting: Nurse Practitioner

## 2022-04-09 VITALS — BP 131/70 | HR 66 | Temp 98.0°F | Wt 256.1 lb

## 2022-04-09 DIAGNOSIS — Z9884 Bariatric surgery status: Secondary | ICD-10-CM | POA: Diagnosis not present

## 2022-04-09 DIAGNOSIS — Z1231 Encounter for screening mammogram for malignant neoplasm of breast: Secondary | ICD-10-CM

## 2022-04-09 DIAGNOSIS — D509 Iron deficiency anemia, unspecified: Secondary | ICD-10-CM

## 2022-04-09 DIAGNOSIS — Z79899 Other long term (current) drug therapy: Secondary | ICD-10-CM | POA: Diagnosis not present

## 2022-04-09 DIAGNOSIS — D649 Anemia, unspecified: Secondary | ICD-10-CM | POA: Insufficient documentation

## 2022-04-09 LAB — CBC WITH DIFFERENTIAL/PLATELET
Abs Immature Granulocytes: 0.01 10*3/uL (ref 0.00–0.07)
Basophils Absolute: 0.1 10*3/uL (ref 0.0–0.1)
Basophils Relative: 1 %
Eosinophils Absolute: 0.1 10*3/uL (ref 0.0–0.5)
Eosinophils Relative: 3 %
HCT: 37.8 % (ref 36.0–46.0)
Hemoglobin: 12.6 g/dL (ref 12.0–15.0)
Immature Granulocytes: 0 %
Lymphocytes Relative: 40 %
Lymphs Abs: 1.9 10*3/uL (ref 0.7–4.0)
MCH: 27.3 pg (ref 26.0–34.0)
MCHC: 33.3 g/dL (ref 30.0–36.0)
MCV: 81.8 fL (ref 80.0–100.0)
Monocytes Absolute: 0.4 10*3/uL (ref 0.1–1.0)
Monocytes Relative: 9 %
Neutro Abs: 2.2 10*3/uL (ref 1.7–7.7)
Neutrophils Relative %: 47 %
Platelets: 253 10*3/uL (ref 150–400)
RBC: 4.62 MIL/uL (ref 3.87–5.11)
RDW: 14.3 % (ref 11.5–15.5)
WBC: 4.8 10*3/uL (ref 4.0–10.5)
nRBC: 0 % (ref 0.0–0.2)

## 2022-04-09 LAB — COMPREHENSIVE METABOLIC PANEL
ALT: 16 U/L (ref 0–44)
AST: 21 U/L (ref 15–41)
Albumin: 4 g/dL (ref 3.5–5.0)
Alkaline Phosphatase: 91 U/L (ref 38–126)
Anion gap: 4 — ABNORMAL LOW (ref 5–15)
BUN: 13 mg/dL (ref 6–20)
CO2: 31 mmol/L (ref 22–32)
Calcium: 9.1 mg/dL (ref 8.9–10.3)
Chloride: 107 mmol/L (ref 98–111)
Creatinine, Ser: 0.78 mg/dL (ref 0.44–1.00)
GFR, Estimated: >60 mL/min (ref >60)
Glucose, Bld: 93 mg/dL (ref 70–99)
Potassium: 3.5 mmol/L (ref 3.5–5.1)
Sodium: 142 mmol/L (ref 135–145)
Total Bilirubin: 0.3 mg/dL (ref 0.3–1.2)
Total Protein: 7 g/dL (ref 6.5–8.1)

## 2022-04-09 LAB — FERRITIN: Ferritin: 23 ng/mL (ref 11–307)

## 2022-04-09 LAB — IRON AND IRON BINDING CAPACITY (CC-WL,HP ONLY)
Iron: 57 ug/dL (ref 28–170)
Saturation Ratios: 18 % (ref 10.4–31.8)
TIBC: 325 ug/dL (ref 250–450)
UIBC: 268 ug/dL (ref 148–442)

## 2022-04-10 ENCOUNTER — Telehealth: Payer: Self-pay | Admitting: Hematology

## 2022-04-10 DIAGNOSIS — I10 Essential (primary) hypertension: Secondary | ICD-10-CM | POA: Diagnosis not present

## 2022-04-10 DIAGNOSIS — G4733 Obstructive sleep apnea (adult) (pediatric): Secondary | ICD-10-CM | POA: Diagnosis not present

## 2022-04-10 NOTE — Telephone Encounter (Signed)
Scheduled follow-up appointment per 7/19 los. Patient is aware. 

## 2022-04-30 ENCOUNTER — Encounter (INDEPENDENT_AMBULATORY_CARE_PROVIDER_SITE_OTHER): Payer: Self-pay

## 2022-05-14 ENCOUNTER — Ambulatory Visit
Admission: RE | Admit: 2022-05-14 | Discharge: 2022-05-14 | Disposition: A | Discharge status: Home / Self Care | Payer: BC Managed Care – PPO | Source: Ambulatory Visit | Attending: Nurse Practitioner | Admitting: Nurse Practitioner

## 2022-05-14 DIAGNOSIS — Z1231 Encounter for screening mammogram for malignant neoplasm of breast: Secondary | ICD-10-CM

## 2022-06-06 DIAGNOSIS — G4733 Obstructive sleep apnea (adult) (pediatric): Secondary | ICD-10-CM | POA: Diagnosis not present

## 2022-06-18 DIAGNOSIS — Z01419 Encounter for gynecological examination (general) (routine) without abnormal findings: Secondary | ICD-10-CM | POA: Diagnosis not present

## 2022-06-18 DIAGNOSIS — Z6841 Body Mass Index (BMI) 40.0 and over, adult: Secondary | ICD-10-CM | POA: Diagnosis not present

## 2022-06-18 DIAGNOSIS — Z1211 Encounter for screening for malignant neoplasm of colon: Secondary | ICD-10-CM | POA: Diagnosis not present

## 2022-06-18 DIAGNOSIS — Z1239 Encounter for other screening for malignant neoplasm of breast: Secondary | ICD-10-CM | POA: Diagnosis not present

## 2023-03-03 ENCOUNTER — Encounter: Payer: Self-pay | Admitting: Nurse Practitioner

## 2023-04-12 ENCOUNTER — Other Ambulatory Visit: Payer: Self-pay | Admitting: Nurse Practitioner

## 2023-04-12 DIAGNOSIS — D509 Iron deficiency anemia, unspecified: Secondary | ICD-10-CM

## 2023-04-12 NOTE — Progress Notes (Deleted)
Patient Care Team: Georgann Housekeeper, MD as PCP - General (Internal Medicine)   CHIEF COMPLAINT: Follow up anemia   CURRENT THERAPY:  1.Oral liquid iron and oral B12 once daily; stopped in 2023, taking MWF 3 x per week 2.IV Venofer 200 mg as needed, last given 04/2020  INTERVAL HISTORY Charlene Pearson returns for follow up as scheduled. Last seen by me 04/09/22.   ROS   Past Medical History:  Diagnosis Date   Asthma    Hypertension    Varicose veins      Past Surgical History:  Procedure Laterality Date   ABDOMINAL HYSTERECTOMY     ROUX-EN-Y PROCEDURE  2009     Outpatient Encounter Medications as of 04/14/2023  Medication Sig   albuterol (VENTOLIN HFA) 108 (90 Base) MCG/ACT inhaler SMARTSIG:2 Puff(s) Via Inhaler PRN   ferrous sulfate 325 (65 FE) MG tablet Take 325 mg by mouth daily with breakfast.   Loratadine 10 MG CAPS Take by mouth.   Multiple Vitamins-Minerals (CENTRUM ADULTS PO) Take by mouth.   valsartan-hydrochlorothiazide (DIOVAN-HCT) 160-12.5 MG per tablet Take 1 tablet by mouth daily.   Vitamin D, Ergocalciferol, (DRISDOL) 1.25 MG (50000 UNIT) CAPS capsule Take 1 capsule (50,000 Units total) by mouth every 7 (seven) days.   No facility-administered encounter medications on file as of 04/14/2023.     There were no vitals filed for this visit. There is no height or weight on file to calculate BMI.   PHYSICAL EXAM GENERAL:alert, no distress and comfortable SKIN: no rash  EYES: sclera clear NECK: without mass LYMPH:  no palpable cervical or supraclavicular lymphadenopathy  LUNGS: clear with normal breathing effort HEART: regular rate & rhythm, no lower extremity edema ABDOMEN: abdomen soft, non-tender and normal bowel sounds NEURO: alert & oriented x 3 with fluent speech, no focal motor/sensory deficits Breast exam:  PAC without erythema    CBC    Component Value Date/Time   WBC 4.8 04/09/2022 0843   RBC 4.62 04/09/2022 0843   HGB 12.6 04/09/2022 0843    HGB 12.5 04/09/2021 0846   HGB 13.5 10/15/2020 1053   HCT 37.8 04/09/2022 0843   HCT 41.7 10/15/2020 1053   PLT 253 04/09/2022 0843   PLT 240 04/09/2021 0846   PLT 168 10/15/2020 1053   MCV 81.8 04/09/2022 0843   MCV 83 10/15/2020 1053   MCH 27.3 04/09/2022 0843   MCHC 33.3 04/09/2022 0843   RDW 14.3 04/09/2022 0843   RDW 14.0 10/15/2020 1053   LYMPHSABS 1.9 04/09/2022 0843   LYMPHSABS 2.0 10/15/2020 1053   MONOABS 0.4 04/09/2022 0843   EOSABS 0.1 04/09/2022 0843   EOSABS 0.1 10/15/2020 1053   BASOSABS 0.1 04/09/2022 0843   BASOSABS 0.1 10/15/2020 1053     CMP     Component Value Date/Time   NA 142 04/09/2022 0843   K 3.5 04/09/2022 0843   CL 107 04/09/2022 0843   CO2 31 04/09/2022 0843   GLUCOSE 93 04/09/2022 0843   BUN 13 04/09/2022 0843   CREATININE 0.78 04/09/2022 0843   CALCIUM 9.1 04/09/2022 0843   PROT 7.0 04/09/2022 0843   ALBUMIN 4.0 04/09/2022 0843   AST 21 04/09/2022 0843   ALT 16 04/09/2022 0843   ALKPHOS 91 04/09/2022 0843   BILITOT 0.3 04/09/2022 0843   GFRNONAA >60 04/09/2022 0843   GFRAA >60 04/11/2019 0245     ASSESSMENT & PLAN:62 yo female with   1. 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 - No evidence of GI bleeding on colonoscopy in 11/2019 per Dr. Loreta Ave -She presented with recurrent symptomatic anemia in 03/2020, work-up showed normal B12, folic acid, copper.  Iron studies consistent with IDA likely from poor dietary absorption from gastric bypass. Main symptom is cold sensation in fingertips and toes. -S/p IV Venofer 200 mg x 5 completed in 04/2020.  Symptoms improved and IDA resolved, she responded very well to IV iron -Given her previous gastric bypass and low oral iron absorption, I recommend for her to take it at least 3 times a week indefinitely.    2. Health maintenance -She is s/p  roux-en-Y gastric bypass in 03/28/2008 -she gained weight back due to stress from her husband passing away and covid19 pandemic. -She was referred back to weight loss management by Dr. Donette Larry but did not take Wellbutrin. - Colonoscopy on 11/30/19 showed 2 sessile polyps in the sigmoid colon, otherwise normal, recall 7-10 years per Dr. Loreta Ave.  -She is s/p hysterectomy.  -She is newly married and relocated to [place] near [place].  She is doing well overall continue healthy active lifestyle   3. HTN, pre-DM, asthma, sickle cell trait, h/o covid19 infection 03/2019, low back with with sciatica -followed by PCP       PLAN:  No orders of the defined types were placed in this encounter.     All questions were answered. The patient knows to call the clinic with any problems, questions or concerns. No barriers to learning were detected. I spent *** counseling the patient face to face. The total time spent in the appointment was *** and more than 50% was on counseling, review of test results, and coordination of care.   Santiago Glad, NP-C @DATE @

## 2023-04-14 ENCOUNTER — Inpatient Hospital Stay: Payer: BC Managed Care – PPO | Admitting: Nurse Practitioner

## 2023-04-14 ENCOUNTER — Inpatient Hospital Stay: Payer: BC Managed Care – PPO

## 2023-04-14 ENCOUNTER — Other Ambulatory Visit: Payer: Self-pay

## 2023-04-14 NOTE — Progress Notes (Signed)
Called patient because of missed appointment, she stated that she called in and cancelled her appointment. She has moved to [place] and she is out of network now.
